# Patient Record
Sex: Female | Born: 1975
Health system: Southern US, Community
[De-identification: ages and names within clinical notes are randomized; demographics above are authoritative.]

## PROBLEM LIST (undated history)

## (undated) DIAGNOSIS — K589 Irritable bowel syndrome without diarrhea: Secondary | ICD-10-CM

## (undated) DIAGNOSIS — I959 Hypotension, unspecified: Secondary | ICD-10-CM

## (undated) HISTORY — PX: TUBAL LIGATION: SHX77

## (undated) HISTORY — PX: CERVICAL CERCLAGE: SHX1329

## (undated) HISTORY — DX: Irritable bowel syndrome, unspecified: K58.9

## (undated) HISTORY — DX: Hypotension, unspecified: I95.9

---

## 2004-06-24 ENCOUNTER — Inpatient Hospital Stay (HOSPITAL_COMMUNITY): Admission: AD | Admit: 2004-06-24 | Discharge: 2004-06-24 | Payer: Self-pay | Admitting: Obstetrics and Gynecology

## 2005-09-14 ENCOUNTER — Inpatient Hospital Stay (HOSPITAL_COMMUNITY): Admission: AD | Admit: 2005-09-14 | Discharge: 2005-09-19 | Payer: Self-pay | Admitting: Obstetrics

## 2005-09-18 ENCOUNTER — Encounter (INDEPENDENT_AMBULATORY_CARE_PROVIDER_SITE_OTHER): Payer: Self-pay | Admitting: *Deleted

## 2006-02-20 ENCOUNTER — Ambulatory Visit (HOSPITAL_COMMUNITY): Admission: RE | Admit: 2006-02-20 | Discharge: 2006-02-20 | Payer: Self-pay | Admitting: Obstetrics and Gynecology

## 2006-04-14 ENCOUNTER — Ambulatory Visit (HOSPITAL_COMMUNITY): Admission: RE | Admit: 2006-04-14 | Discharge: 2006-04-14 | Payer: Self-pay | Admitting: Obstetrics

## 2006-04-16 ENCOUNTER — Ambulatory Visit (HOSPITAL_COMMUNITY): Admission: RE | Admit: 2006-04-16 | Discharge: 2006-04-16 | Payer: Self-pay | Admitting: Obstetrics

## 2006-04-30 ENCOUNTER — Inpatient Hospital Stay (HOSPITAL_COMMUNITY): Admission: AD | Admit: 2006-04-30 | Discharge: 2006-04-30 | Payer: Self-pay | Admitting: Obstetrics

## 2006-05-29 ENCOUNTER — Ambulatory Visit (HOSPITAL_COMMUNITY): Admission: RE | Admit: 2006-05-29 | Discharge: 2006-05-29 | Payer: Self-pay | Admitting: Obstetrics

## 2006-06-11 ENCOUNTER — Ambulatory Visit (HOSPITAL_COMMUNITY): Admission: RE | Admit: 2006-06-11 | Discharge: 2006-06-11 | Payer: Self-pay | Admitting: Obstetrics

## 2006-06-14 ENCOUNTER — Inpatient Hospital Stay (HOSPITAL_COMMUNITY): Admission: AD | Admit: 2006-06-14 | Discharge: 2006-07-19 | Payer: Self-pay | Admitting: Obstetrics

## 2006-06-24 ENCOUNTER — Encounter: Payer: Self-pay | Admitting: Obstetrics

## 2006-09-25 ENCOUNTER — Inpatient Hospital Stay (HOSPITAL_COMMUNITY): Admission: AD | Admit: 2006-09-25 | Discharge: 2006-09-27 | Payer: Self-pay | Admitting: Obstetrics

## 2006-10-01 ENCOUNTER — Inpatient Hospital Stay (HOSPITAL_COMMUNITY): Admission: AD | Admit: 2006-10-01 | Discharge: 2006-10-02 | Payer: Self-pay | Admitting: Obstetrics

## 2007-06-24 ENCOUNTER — Ambulatory Visit (HOSPITAL_COMMUNITY): Admission: RE | Admit: 2007-06-24 | Discharge: 2007-06-24 | Payer: Self-pay | Admitting: Obstetrics

## 2007-08-05 ENCOUNTER — Ambulatory Visit (HOSPITAL_COMMUNITY): Admission: RE | Admit: 2007-08-05 | Discharge: 2007-08-05 | Payer: Self-pay | Admitting: Obstetrics

## 2007-08-25 ENCOUNTER — Ambulatory Visit (HOSPITAL_COMMUNITY): Admission: RE | Admit: 2007-08-25 | Discharge: 2007-08-25 | Payer: Self-pay | Admitting: Obstetrics

## 2007-09-22 ENCOUNTER — Ambulatory Visit (HOSPITAL_COMMUNITY): Admission: RE | Admit: 2007-09-22 | Discharge: 2007-09-22 | Payer: Self-pay | Admitting: Obstetrics

## 2007-10-07 ENCOUNTER — Ambulatory Visit (HOSPITAL_COMMUNITY): Admission: RE | Admit: 2007-10-07 | Discharge: 2007-10-07 | Payer: Self-pay | Admitting: Obstetrics

## 2007-10-27 ENCOUNTER — Ambulatory Visit (HOSPITAL_COMMUNITY): Admission: RE | Admit: 2007-10-27 | Discharge: 2007-10-27 | Payer: Self-pay | Admitting: Obstetrics

## 2007-12-11 ENCOUNTER — Inpatient Hospital Stay (HOSPITAL_COMMUNITY): Admission: AD | Admit: 2007-12-11 | Discharge: 2007-12-12 | Payer: Self-pay | Admitting: Obstetrics

## 2007-12-20 ENCOUNTER — Inpatient Hospital Stay (HOSPITAL_COMMUNITY): Admission: AD | Admit: 2007-12-20 | Discharge: 2007-12-23 | Payer: Self-pay | Admitting: Obstetrics

## 2008-02-17 ENCOUNTER — Ambulatory Visit (HOSPITAL_COMMUNITY): Admission: RE | Admit: 2008-02-17 | Discharge: 2008-02-17 | Payer: Self-pay | Admitting: Obstetrics

## 2010-05-27 ENCOUNTER — Encounter: Payer: Self-pay | Admitting: Obstetrics

## 2010-09-18 NOTE — Op Note (Signed)
Terri Cooper, ORD             ACCOUNT NO.:  [id number]   MEDICAL RECORD NO.:  [id number]          PATIENT TYPE:  AMB   LOCATION:  SDC                           FACILITY:  [facility]   PHYSICIAN:  Kathreen Cosier, M.D.DATE OF BIRTH:  02/08/1976   DATE OF PROCEDURE:  06/24/2007  DATE OF DISCHARGE:                               OPERATIVE REPORT   PREOPERATIVE DIAGNOSIS:  Incompetent cervix.   POSTOPERATIVE DIAGNOSIS:  Incompetent cervix.   PROCEDURE:  Cervical cerclage.   DESCRIPTION OF PROCEDURE:  After the spinal was placed, the patient was  placed in the lithotomy position, perineum and vagina prepped and  draped.  Bladder emptied with a straight catheter.  A weighted speculum  placed in the vagina.  Cervix was grasped with sponge forceps at 12  o'clock, and starting at that point using a #5 Mersilene band a  [employer] suture was placed and tied at 6 o'clock.  The patient  tolerated the procedure well, and taken to recovery room in good  condition.           ______________________________  Kathreen Cosier, M.D.     BAM/MEDQ  D:  06/24/2007  T:  06/25/2007  Job:  [id number]

## 2010-09-18 NOTE — H&P (Signed)
[name], [name]             ACCOUNT NO.:  [id number]   MEDICAL RECORD NO.:  [id number]          PATIENT TYPE:  INP   LOCATION:  9167                          FACILITY:  [facility]   PHYSICIAN:  Roseanna Rainbow, M.D.DATE OF BIRTH:  23-May-1975   DATE OF ADMISSION:  12/20/2007  DATE OF DISCHARGE:                              HISTORY & PHYSICAL   CHIEF COMPLAINT:  The patient is a 35 year old para 1 with an estimated  date of confinement of December 22, 2007, with an intrauterine pregnancy  at 39+ weeks complaining of contractions.   HISTORY OF PRESENT ILLNESS:  Please see the above.   SOCIAL HISTORY:  The patient is married, unemployed, denies any tobacco,  ethanol or drug use.   ALLERGIES:  No known drug allergies.   PAST GYN HISTORY:  Normal triad.   PAST OBSTETRICAL HISTORY:  There is a history of a 22-week loss felt to  be secondary to cervical insufficiency in 2008.  She has delivered of a  live born female at full term, 6 pounds 15 ounces vaginal delivery.  During that pregnancy, she received Delalutin and a cerclage was placed.   ANTEPARTUM COURSE:  The patient has received 17-hydroxyprogesterone  injections, also received a course of Procardia for tocolysis and she  had a cerclage placed as well for her history of cervical insufficiency.   PAST MEDICAL HISTORY:  She denies.   FAMILY HISTORY:  She denies.   PAST SURGICAL HISTORY:  Please see the above.   PRENATAL LABORATORY DATA:  Hemoglobin 12.2, hematocrit 36.3, and  platelets 251,000.  Blood type A+, antibody screen negative.  Sickle  cell trait negative.  RPR nonreactive.  Rubella immune.  Hepatitis B  surface antigen negative.  HIV nonreactive.  PPD negative.  Pap smear  negative.  GC probe negative.  Chlamydia probe negative.  Quad screen  normal.  One-hour GCT 113.  GBS negative on November 25, 2007.  Ultrasound  at 20 weeks 1 day, no previa, normal anatomy.  Ultrasound at 23 weeks,  appropriate growth.   Ultrasound at 27 weeks 0 days, appropriate growth.  Ultrasound at 29 weeks 1 day, appropriate growth.   PHYSICAL EXAMINATION:  Vital signs stable, afebrile.  Fetal heart  tracing reassuring.  Tocodynamometer, uterine contractions every 2  minutes.  Sterile vaginal exam per the RN and the cervix is 5 cm  dilated.   ASSESSMENT:  Primipara at term, late latent versus early active labor,  fetal heart tracing consistent with fetal well being.   PLAN:  Admission.  Expectant management.      Roseanna Rainbow, M.D.  Electronically Signed     LAJ/MEDQ  D:  12/20/2007  T:  12/21/2007  Job:  [id number]

## 2010-09-18 NOTE — Op Note (Signed)
NAMERANISHA, Terri Cooper             ACCOUNT NO.:  [id number]   MEDICAL RECORD NO.:  [id number]          PATIENT TYPE:  AMB   LOCATION:  SDC                           [facility]:  [facility]   PHYSICIAN:  Kathreen Cosier, M.D.DATE OF BIRTH:  09/11/1975   DATE OF PROCEDURE:  02/17/2008  DATE OF DISCHARGE:                               OPERATIVE REPORT   PREOPERATIVE DIAGNOSIS:  Multiparity.   POSTOPERATIVE DIAGNOSIS:  Multiparity.   PROCEDURE:  Laparoscopic tubal sterilization.   Under general anesthesia, the patient in lithotomy position, perineum  and vagina prepped and draped, bladder emptied with straight catheter.  Transverse subumbilical incision made, carried down to the fascia.  Fascia cleaned, grasped with two Kochers in the fascia and peritoneum  with Mayo scissors.  Sleeve of the trocar inserted intraperitoneally, 3  L carbon dioxide infused intraperitoneally.  The uterus, tubes, and  ovaries normal.  Visualizing scope inserted through the sleeve of the  trocar and cautery probe inserted through the sleeve of the scope.  Starting 1 inch from meconium on the right, the tube was cauterized, a  total of four places moving lateral from the first site of cautery.  Procedure was done in a similar fashion on the other side.  Each tube  was cauterized in 4 places each.  The patient tolerated the procedure  well.  CO2 allowed to escape from the peritoneal cavity.  Fascia closed  with one stitch of 0 Dexon.  Skin closed with subcuticular stitch of 4-0  Monocryl.           ______________________________  Kathreen Cosier, M.D.     BAM/MEDQ  D:  02/17/2008  T:  02/17/2008  Job:  [id number]

## 2010-09-21 NOTE — Discharge Summary (Signed)
Terri Cooper, Terri Cooper             ACCOUNT NO.:  [id number]   MEDICAL RECORD NO.:  [id number]          PATIENT TYPE:  INP   LOCATION:  9304                          [facility]ILITY:  [facility]   PHYSICIAN:  Kathreen Cosier, M.D.DATE OF BIRTH:  Sep 03, 1975   DATE OF ADMISSION:  09/14/2005  DATE OF DISCHARGE:  09/19/2005                                 DISCHARGE SUMMARY   SUMMARY:  The patient is a 35 year old gravida 1, EDC January 15, 2006 who  started cramping and spotting on May 12. Ultrasound performed showed  internal os was open, the cervix was 1.9 cm long. She was [redacted] weeks pregnant  and having premature contractions. It was decided to place on magnesium  sulfate. This was done. A Foley catheter was inserted. She was not leaking  any fluid. She was kept on total bed rest. The patient had a second  trimester spontaneous AB. On May 16, she had a nonviable female, Apgars 2  and 0. Post delivery hemoglobin 9.4. She was discharged on May 17 on Tylenol  #3 for pain and ampicillin 500 p.o. q.6h. for 5 days.   DISCHARGE DIAGNOSIS:  Status post second trimester spontaneous abortion at  20 weeks.           ______________________________  Kathreen Cosier, M.D.     BAM/MEDQ  D:  10/23/2005  T:  10/23/2005  Job:  [id number]

## 2010-09-21 NOTE — Consult Note (Signed)
Terri Cooper, Terri Cooper             ACCOUNT NO.:  [id number]   MEDICAL RECORD NO.:  [id number]          PATIENT TYPE:  AMB   LOCATION:  SDC                           [facility]   PHYSICIAN:  Kathreen Cosier, M.D.DATE OF BIRTH:  Mar 29, 1976   DATE OF CONSULTATION:  04/16/2006  DATE OF DISCHARGE:                                 CONSULTATION   PREOPERATIVE DIAGNOSES:  1. Incompetent cervix.  2. Intrauterine pregnancy at 14 weeks.   PROCEDURES:  Cervical cerclage.   ANESTHESIA:  Spinal.   DESCRIPTION OF PROCEDURE:  The patient was placed in the lithotomy  position after spinal administered.  Vagina prepped and draped.  Straight catheter with speculum placed in the vagina.  The cervix was  grasped with a sponge stick at 12 o'clock.  Using a #5 Mersilene band,  [employer] suture was placed high up on the cervix in the usual manner and  tied  at 6 o'clock.  There were no noted 2 small tears in the vagina,  and hemostasis was achieved with #0 chromic suture interrupted.  The  patient tolerated the procedure well and was taken to the recovery room  in good condition.           ______________________________  Kathreen Cosier, M.D.     BAM/MEDQ  D:  04/16/2006  T:  04/16/2006  Job:  [id number]

## 2010-09-21 NOTE — Discharge Summary (Signed)
NAMECASH, MEADOW             ACCOUNT NO.:  [id number]   MEDICAL RECORD NO.:  [id number]          PATIENT TYPE:  INP   LOCATION:                                [facility]   PHYSICIAN:  Kathreen Cosier, M.D.DATE OF BIRTH:  [birth year]   DATE OF ADMISSION:  10/01/2006  DATE OF DISCHARGE:  10/02/2006                               DISCHARGE SUMMARY   HISTORY OF PRESENT ILLNESS:  The patient is 35 year old gravida 2, para  1-0-1-1 with a normal vaginal delivery in Sep 25, 2006. She was admitted  for the chills, fever and lower abdominal pain and a temperature of  103.4.   HOSPITAL COURSE:  On admission the uterus was tender white count 8.4,  hemoglobin 10.5. She was admitted with the diagnosis of endometritis and  treated with ampicillin and gentamicin IV. She rapidly defervesced and  was discharged on May 29, day after admission, not having had any fever  for greater than 24 hours.  She was discharged home on ampicillin 500  p.o. q.6 hours to see me in 6 weeks.   DISCHARGE DIAGNOSIS:  Status post postpartum endometritis.           ______________________________  Kathreen Cosier, M.D.     BAM/MEDQ  D:  11/05/2006  T:  11/05/2006  Job:  [id number]

## 2010-09-21 NOTE — Discharge Summary (Signed)
[name], [name]             ACCOUNT NO.:  [id number]   MEDICAL RECORD NO.:  [id number]          PATIENT TYPE:  INP   LOCATION:  9154                          [facility]ILITY:  [facility]   PHYSICIAN:  Kathreen Cosier, M.D.DATE OF BIRTH:  [birth year]   DATE OF ADMISSION:  06/14/2006  DATE OF DISCHARGE:  07/19/2006                               DISCHARGE SUMMARY   HISTORY OF PRESENT ILLNESS:  The patient is a 35 year old gravida 2 para  0-0-1-0 who had 23 week spontaneous abortion in 2007. Her EDC was  10/12/06.  She has a history of incompetent cervix and she has a cervical  cerclage. She was admitted because of occasional cramping and she was in  for bed rest and magnesium sulfate.  Ultrasound on admission revealed 24  weeks and the membranes were funneling to the cerclage.  She received  ampicillin IV for 10 days and she had a Foley catheter placed.  On  06/19/06, her ampicillin was discontinued and she was started on Macrobid  1 p.o. b.i.d. because of her indwelling catheter.  On 06/19/06, she was  started on betamethasone 12.5 mg x2 doses.  The patient was continued  on her magnesium sulfate 2 grams per hour and it was decided at 28 weeks  for her to be discharged to home on bed rest. Her Foley catheter was  removed and she was started on Procardia 60 mg XL and she tolerated this  well with no contractions. Ultrasound while in the hospital revealed  normal growth of the baby. She was discharged on 07/19/06 on Procardia XL  60, Pepcid 20 mg p.o. b.i.d., Xanax 0.5 p.o. t.i.d. p.r.n. and on total  bed rest at home. She is to see me weekly for Delalutin injections in my  office.   DISCHARGE DIAGNOSIS:  Status post hospitalization for incompetent cervix  and premature contractions.           ______________________________  Kathreen Cosier, M.D.     BAM/MEDQ  D:  08/13/2006  T:  08/13/2006  Job:  [id number]

## 2011-01-25 LAB — CBC
Hemoglobin: 12.7
MCHC: 35
MCV: 89.7
RBC: 4.04
RDW: 13.2

## 2011-02-04 LAB — CBC
MCHC: 32.8
MCV: 89.1
Platelets: 292
RBC: 4.19

## 2011-02-04 LAB — PREGNANCY, URINE: Preg Test, Ur: NEGATIVE

## 2014-08-03 ENCOUNTER — Telehealth: Payer: Self-pay | Admitting: Internal Medicine

## 2014-08-03 NOTE — Telephone Encounter (Signed)
Call to patient to confirm appointment for 08/04/14 at 3:15 phone is not in service

## 2014-08-04 ENCOUNTER — Ambulatory Visit (INDEPENDENT_AMBULATORY_CARE_PROVIDER_SITE_OTHER): Payer: Medicaid Other | Admitting: Internal Medicine

## 2014-08-04 ENCOUNTER — Encounter: Payer: Self-pay | Admitting: Internal Medicine

## 2014-08-04 VITALS — BP 118/56 | HR 78 | Temp 98.1°F | Ht 61.9 in | Wt 139.6 lb

## 2014-08-04 DIAGNOSIS — Z7189 Other specified counseling: Secondary | ICD-10-CM

## 2014-08-04 DIAGNOSIS — Z7689 Persons encountering health services in other specified circumstances: Secondary | ICD-10-CM

## 2014-08-04 DIAGNOSIS — Z0000 Encounter for general adult medical examination without abnormal findings: Secondary | ICD-10-CM

## 2014-08-04 NOTE — Patient Instructions (Signed)
1. Please come to clinic for lab work next Wednesday afternoon.  In the meantime, I will send for records from your previous doctor.   2. Please take all medications as prescribed.    3. If you have worsening of your symptoms or new symptoms arise, please call the clinic [phone]), or go to the ER immediately if symptoms are severe.

## 2014-08-04 NOTE — Progress Notes (Signed)
Subjective:    Patient ID: Terri Cooper, female    DOB: 05/13/1975, 39 y.o.   MRN: [id number]  HPI Comments: Ms. Terri Cooper is a 39 year old woman who presents [facility].  Her husband is currently a patient and she is switching providers at his recommendation because he is pleased with the care he receives here.  She reports hx of hypotension dx last year (she has sympx of lightheadedness, fatigue that responds to food/hydration; full work-up unrevealing per patient) and constipation-predominate IBS dx as a teenager (previously on medication x 1 month in her early 3120s but preferred not to be on med, currently controlled by diet and peppermint tea).   She has no new acute complaints today.   Past PMH:  Hypotension, IBS, PMSD, vitamin D deficiency Past Surgery:  Cervical cerclage, wisdom teeth removed under general anesthesia, tubal ligation Past GYN hx:  G3P2; 1 fetal death at 5 months gestation.  Living children - a girl age 557 and a boy age 316.  FDLMP 07/22/14, periods regular, she follows with an ObGyn and reports most recent PAP was in early 2015, she denies hx of abnormal PAP Family hx: mom - DM, dad - HTN, maternal GM - died from breast CA in her 7050s, no other significant family hx that she is aware of Allergies reviewed:  Dyspnea with Codeine  Review of Systems  Constitutional: Negative for fever, chills, appetite change and unexpected weight change.  HENT: Negative for hearing loss.   Eyes: Negative for visual disturbance.  Respiratory: Negative for cough, shortness of breath and wheezing.   Cardiovascular: Negative for chest pain, palpitations and leg swelling.  Gastrointestinal: Positive for abdominal pain. Negative for nausea, vomiting, diarrhea, constipation and blood in stool.       Occasional belly pain that she feels is related to abdominal hernia.  Endocrine: Negative for polydipsia, polyphagia and polyuria.  Genitourinary: Negative for dysuria, frequency and  hematuria.  Neurological: Positive for dizziness and light-headedness. Negative for syncope.       Occasional lightheadedness and dizziness sometimes with position changes, sometimes at rest.   Psychiatric/Behavioral: Negative for dysphoric mood.       Filed Vitals:   08/04/14 1558  BP: 118/56  Pulse: 78  Temp: 98.1 F (36.7 C)  TempSrc: Oral  Height: 5' 1.9" (1.572 m)  Weight: 139 lb 9.6 oz (63.322 kg)  SpO2: 100%   Objective:   Physical Exam  Constitutional: She is oriented to person, place, and time. She appears well-developed. No distress.  HENT:  Head: Normocephalic and atraumatic.  Right Ear: External ear normal.  Left Ear: External ear normal.  Mouth/Throat: Oropharynx is clear and moist. No oropharyngeal exudate.  TMs with good cone of light B/L  Eyes: Conjunctivae and EOM are normal. Pupils are equal, round, and reactive to light. Right eye exhibits no discharge. Left eye exhibits no discharge. No scleral icterus.  Neck: Neck supple. No thyromegaly present.  Cardiovascular: Normal rate, regular rhythm and normal heart sounds.  Exam reveals no gallop and no friction rub.   No murmur heard. Pulmonary/Chest: Effort normal and breath sounds normal. No respiratory distress. She has no wheezes. She has no rales.  Abdominal: Soft. Bowel sounds are normal. She exhibits no distension and no mass. There is no tenderness. There is no rebound and no guarding.  Musculoskeletal: Normal range of motion. She exhibits no edema or tenderness.  Lymphadenopathy:    She has no cervical adenopathy.  Neurological:  She is alert and oriented to person, place, and time. She has normal reflexes. She displays normal reflexes. No cranial nerve deficit.  Skin: Skin is warm. She is not diaphoretic.  Psychiatric: She has a normal mood and affect. Her behavior is normal. Judgment and thought content normal.  Vitals reviewed.         Assessment & Plan:  Please see problem based assessment and  plan.

## 2014-08-05 ENCOUNTER — Encounter: Payer: Self-pay | Admitting: Internal Medicine

## 2014-08-05 DIAGNOSIS — Z0000 Encounter for general adult medical examination without abnormal findings: Secondary | ICD-10-CM | POA: Insufficient documentation

## 2014-08-05 DIAGNOSIS — K589 Irritable bowel syndrome without diarrhea: Secondary | ICD-10-CM | POA: Insufficient documentation

## 2014-08-05 NOTE — Assessment & Plan Note (Addendum)
Terri Cooper is a healthy 39 year old with hx of IBS (controlled) and hypotension (BP ok at 118/56 today) who experiences occasional dizziness and lightheadedness both with position change and at rest.  She says symptoms responds to hydration and eating.  She reports full work-up completed last year did not reveal etiology and she was instructed to stay hydrated.  Overall, she feels well today and does not have specific complaints she wants addressed.  She was last seen by her prior doctor about 1 year ago.  She also follows with ObGyn and reports being up to date on PAP smear.    - The lab was closed at the time of the visit so I have asked the patient to come back next week for basic blood work (CBC, CMP, lipids). - I will send for records from her prior provider. - I will determine the timeframe for follow-up based on results of labs and review of records; given relatively good health and young age she will likely only need yearly follow-up.

## 2014-08-08 NOTE — Progress Notes (Signed)
Case discussed with Dr. Wilson soon after the resident saw the patient. We reviewed the resident's history and exam and pertinent patient test results. I agree with the assessment, diagnosis, and plan of care documented in the resident's note. 

## 2014-11-16 NOTE — Addendum Note (Signed)
Addended by: Remus BlakeBARROW, Jhania Etherington K on: 11/16/2014 12:01 PM   Modules accepted: Orders

## 2015-03-29 ENCOUNTER — Encounter: Payer: Medicaid Other | Admitting: Internal Medicine

## 2015-04-17 ENCOUNTER — Other Ambulatory Visit: Payer: Self-pay | Admitting: Obstetrics

## 2015-04-17 DIAGNOSIS — Z1231 Encounter for screening mammogram for malignant neoplasm of breast: Secondary | ICD-10-CM

## 2015-04-19 ENCOUNTER — Encounter: Payer: Self-pay | Admitting: Internal Medicine

## 2015-04-19 ENCOUNTER — Ambulatory Visit (INDEPENDENT_AMBULATORY_CARE_PROVIDER_SITE_OTHER): Payer: Medicaid Other | Admitting: Internal Medicine

## 2015-04-19 VITALS — BP 114/61 | HR 76 | Temp 97.9°F | Ht 61.5 in | Wt 138.5 lb

## 2015-04-19 DIAGNOSIS — M7989 Other specified soft tissue disorders: Secondary | ICD-10-CM | POA: Diagnosis not present

## 2015-04-19 DIAGNOSIS — H04123 Dry eye syndrome of bilateral lacrimal glands: Secondary | ICD-10-CM

## 2015-04-19 DIAGNOSIS — R42 Dizziness and giddiness: Secondary | ICD-10-CM

## 2015-04-19 DIAGNOSIS — K589 Irritable bowel syndrome without diarrhea: Secondary | ICD-10-CM | POA: Diagnosis not present

## 2015-04-19 DIAGNOSIS — Z0000 Encounter for general adult medical examination without abnormal findings: Secondary | ICD-10-CM

## 2015-04-19 NOTE — Progress Notes (Signed)
Subjective:    Patient ID: Terri Cooper, female    DOB: 21-Jul-1975, 39 y.o.   MRN: [id number]  HPI Comments: Terri Cooper is a 39 year old woman with PMH of IBS here for follow-up.  She tells me she felt it was time for a check-up because it is the end of the year.  She is overall feeling well but does have c/o dry eyes x 1 year and vertigo for a few years.  Please see problem based charting for status of these conditions.     Past Medical History  Diagnosis Date  . Irritable bowel syndrome (IBS)     constipation predominate  . Hypotension    Current Outpatient Prescriptions on File Prior to Visit  Medication Sig Dispense Refill  . Multiple Vitamin (MULTIVITAMIN) tablet Take 1 tablet by mouth daily.     No current facility-administered medications on file prior to visit.    Review of Systems  Constitutional: Negative for fever, chills, appetite change and unexpected weight change.  HENT: Negative for ear discharge, ear pain, hearing loss and tinnitus.   Eyes: Negative for photophobia, pain, discharge, redness, itching and visual disturbance.       Dry eyes x 1 year.  No infectious signs.  Stopped wearing contacts 9 year ago.  Refresh is helping.   Respiratory: Negative for cough, shortness of breath and wheezing.   Cardiovascular: Positive for leg swelling. Negative for chest pain and palpitations.       B/L feet swlling (right > left)  Gastrointestinal: Positive for abdominal pain and constipation. Negative for nausea, vomiting, diarrhea and blood in stool.       She feels peppermint tea is helping.  Controlling stress helps.   Genitourinary: Negative for menstrual problem and pelvic pain.  Neurological: Positive for dizziness. Negative for syncope and light-headedness.       Vertigo - feels like the room is spinning, helps to sit down; occurs a few times per year.  Last episode was last month and related to not sleeping.  Usually lasts about 1 hour.   Same as what she reports  last visit.  Not worse.       Filed Vitals:   04/19/15 1507  BP: 114/61  Pulse: 76  Temp: 97.9 F (36.6 C)  TempSrc: Oral  Height: 5' 1.5" (1.562 m)  Weight: 138 lb 8 oz (62.823 kg)  SpO2: 100%  , Objective:   Physical Exam  Constitutional: She is oriented to person, place, and time. She appears well-developed. No distress.  HENT:  Head: Normocephalic and atraumatic.  Mouth/Throat: Oropharynx is clear and moist. No oropharyngeal exudate.  Eyes: Conjunctivae and EOM are normal. Pupils are equal, round, and reactive to light. Right eye exhibits no discharge. Left eye exhibits no discharge. No scleral icterus.  Neck: Neck supple.  Cardiovascular: Normal rate, regular rhythm, normal heart sounds and intact distal pulses.  Exam reveals no gallop and no friction rub.   No murmur heard. Pulmonary/Chest: Effort normal and breath sounds normal. No respiratory distress. She has no wheezes. She has no rales.  Abdominal: Soft. Bowel sounds are normal. She exhibits no distension and no mass. There is no tenderness. There is no rebound and no guarding.  Musculoskeletal: Normal range of motion. She exhibits no edema or tenderness.  Neurological: She is alert and oriented to person, place, and time. She displays normal reflexes. No cranial nerve deficit. She exhibits normal muscle tone.  Strength and sensation grossly intact.  Negative  Dix-Hallpike  Skin: Skin is warm. She is not diaphoretic.  Psychiatric: She has a normal mood and affect. Her behavior is normal. Judgment and thought content normal.  Vitals reviewed.         Assessment & Plan:  Please see problem based charting for A&P.

## 2015-04-19 NOTE — Patient Instructions (Signed)
1. I will try to get records from your prior doctor.  Please also ask your gynecologist to send me records.  It is okay to continue Refresh as needed for your dry eye.  If you develop eye redness, discharge, loss of vision or new symptoms you need to come back to see me or see an eye doctor.  Please call me back and let me know what medication you used for your dizziness.   2. Please take all medications as prescribed.    3. If you have worsening of your symptoms or new symptoms arise, please call the clinic ([phone]), or go to the ER immediately if symptoms are severe.   Come back to see me in 3 months for your yearly physical or sooner if dizziness persists.  We may need to refer you to a specialist at that time.

## 2015-04-21 DIAGNOSIS — M7989 Other specified soft tissue disorders: Secondary | ICD-10-CM | POA: Insufficient documentation

## 2015-04-21 DIAGNOSIS — H04123 Dry eye syndrome of bilateral lacrimal glands: Secondary | ICD-10-CM | POA: Insufficient documentation

## 2015-04-21 DIAGNOSIS — Z0000 Encounter for general adult medical examination without abnormal findings: Secondary | ICD-10-CM | POA: Insufficient documentation

## 2015-04-21 DIAGNOSIS — R42 Dizziness and giddiness: Secondary | ICD-10-CM | POA: Insufficient documentation

## 2015-04-21 NOTE — Assessment & Plan Note (Signed)
Assessment:  She says her feet sometimes swell after sitting for long periods at work.  Feet below ankles, R > L.  Legs not involved.  If she moves around or puts feet up this resolves.  There is no swelling today.  She has no S&S of heart, liver or kidney problems.  She is very young for venous insufficiency but I do suspect the mild swelling is positional from sitting with feet down for long periods. Plan:  Continue to elevate when able.  I advised she buy some compression-type socks or hose.  She will follow-up with me for yearly check-up in 3 months.

## 2015-04-21 NOTE — Assessment & Plan Note (Addendum)
Assessment:  Non-positional, lasting about 1 hour and negative Dix-Hallpike make BPPV less likely.  She denies headache or N/V so vertiginous migraine less likely.  No tinnitus or hearing loss to suggest Meniere's.  Her young age, lack of CVD risk factors, chronicity and lack of neurologic abnormalities on physical exam make more serious central pathology unlikely.  There is no indication for urgent MRI.  Furthermore, she responded to her mother's vertigo medication (I suspect this was meclizine).  She says her mother has this problem as well and told her it runs in the family.  She has used the medication twice with immediate relief in symptoms.  At our last visit, she reported lightheadedness and low BP but says this has completely resolved.   Plan:  She feels that episodes are related to inadequate sleep so she will avoid this.  She will call me with the name of the medication her mother gave her.  No indication for urgent MRI but she will return in 3 months for yearly exam.  Will reassess at that time.  If new or worsening symptoms, will consider MRI to r/o MS or Chiari Malformation.

## 2015-04-21 NOTE — Assessment & Plan Note (Signed)
Assessment:  She says symptoms are controlled with peppermint tea, controlling stress and exercising. Plan:  She will continue above.  If symptoms return can consider medication in the future.

## 2015-04-21 NOTE — Assessment & Plan Note (Signed)
Assessment:  Intermittent dry eyes, but no itching, redness, pain, red eye or vision loss to suggest infection or ischemia.  She has not worn contact lenses in 9 years.  She is occasionally using Refresh drops which provide relief.   Plan:  Continue prn Refresh.  She was advised to visit eye doctor if new or worsening symptoms.  She will return to see me for routine check-up in 3 months.

## 2015-04-21 NOTE — Assessment & Plan Note (Signed)
She reports recent PAP at gyn.  Will send for records.

## 2015-04-25 NOTE — Addendum Note (Signed)
Addended by: Doneen PoissonKLIMA, Sharrell Krawiec D on: 04/25/2015 10:58 AM   Modules accepted: Level of Service

## 2015-04-25 NOTE — Progress Notes (Signed)
Case discussed with Dr. Wislon soon after the resident saw the patient.  We reviewed the resident's history and exam and pertinent patient test results.  I agree with the assessment, diagnosis and plan of care documented in the resident's note. 

## 2015-05-09 ENCOUNTER — Ambulatory Visit: Payer: Medicaid Other

## 2016-01-27 LAB — HM PAP SMEAR

## 2016-03-11 ENCOUNTER — Telehealth: Payer: Self-pay | Admitting: *Deleted

## 2016-03-11 ENCOUNTER — Encounter (HOSPITAL_COMMUNITY): Payer: Self-pay | Admitting: Emergency Medicine

## 2016-03-11 ENCOUNTER — Emergency Department (HOSPITAL_COMMUNITY)
Admission: EM | Admit: 2016-03-11 | Discharge: 2016-03-11 | Disposition: A | Discharge status: Home / Self Care | Payer: Medicaid Other | Attending: Emergency Medicine | Admitting: Emergency Medicine

## 2016-03-11 DIAGNOSIS — R55 Syncope and collapse: Secondary | ICD-10-CM | POA: Insufficient documentation

## 2016-03-11 DIAGNOSIS — R42 Dizziness and giddiness: Secondary | ICD-10-CM

## 2016-03-11 LAB — COMPREHENSIVE METABOLIC PANEL
ALK PHOS: 44 U/L (ref 38–126)
ALT: 16 U/L (ref 14–54)
ANION GAP: 9 (ref 5–15)
AST: 18 U/L (ref 15–41)
Albumin: 4 g/dL (ref 3.5–5.0)
BILIRUBIN TOTAL: 0.7 mg/dL (ref 0.3–1.2)
BUN: 10 mg/dL (ref 6–20)
CALCIUM: 9.3 mg/dL (ref 8.9–10.3)
CO2: 26 mmol/L (ref 22–32)
Chloride: 104 mmol/L (ref 101–111)
Creatinine, Ser: 0.79 mg/dL (ref 0.44–1.00)
GLUCOSE: 97 mg/dL (ref 65–99)
POTASSIUM: 4.2 mmol/L (ref 3.5–5.1)
Sodium: 139 mmol/L (ref 135–145)
TOTAL PROTEIN: 8.1 g/dL (ref 6.5–8.1)

## 2016-03-11 LAB — CBC WITH DIFFERENTIAL/PLATELET
BASOS PCT: 0 %
Basophils Absolute: 0 10*3/uL (ref 0.0–0.1)
Eosinophils Absolute: 0.2 10*3/uL (ref 0.0–0.7)
Eosinophils Relative: 3 %
HEMATOCRIT: 40.6 % (ref 36.0–46.0)
HEMOGLOBIN: 13.8 g/dL (ref 12.0–15.0)
LYMPHS ABS: 2.3 10*3/uL (ref 0.7–4.0)
LYMPHS PCT: 30 %
MCH: 30.3 pg (ref 26.0–34.0)
MCHC: 34 g/dL (ref 30.0–36.0)
MCV: 89 fL (ref 78.0–100.0)
MONO ABS: 0.6 10*3/uL (ref 0.1–1.0)
MONOS PCT: 8 %
NEUTROS ABS: 4.5 10*3/uL (ref 1.7–7.7)
NEUTROS PCT: 59 %
Platelets: 303 10*3/uL (ref 150–400)
RBC: 4.56 MIL/uL (ref 3.87–5.11)
RDW: 13 % (ref 11.5–15.5)
WBC: 7.7 10*3/uL (ref 4.0–10.5)

## 2016-03-11 LAB — POC URINE PREG, ED: Preg Test, Ur: NEGATIVE

## 2016-03-11 MED ORDER — MECLIZINE HCL 25 MG PO TABS: 25.0000 mg | ORAL_TABLET | Freq: Three times a day (TID) | ORAL | 0 refills | Status: DC | PRN

## 2016-03-11 MED ORDER — SODIUM CHLORIDE 0.9 % IV BOLUS (SEPSIS)
1000.0000 mL | Freq: Once | INTRAVENOUS | Status: AC
  Administered 2016-03-11: 1000 mL via INTRAVENOUS

## 2016-03-11 MED ORDER — MECLIZINE HCL 25 MG PO TABS
25.0000 mg | ORAL_TABLET | Freq: Once | ORAL | Status: AC
  Administered 2016-03-11: 25 mg via ORAL
  Filled 2016-03-11: qty 1

## 2016-03-11 NOTE — Discharge Instructions (Signed)
Medications: Meclizine  Treatment: Take meclizine up to 3 times daily as prescribed as needed for dizziness. Make sure to drink plenty of water, at least 64 ounces daily. Do not take Mucinex, as this could be a cause of your dizziness. It may be advantageous for you to slightly increase her salt intake to elevate your blood pressure.  Follow-up: Please follow-up with your primary care provider in next 2-3 days for further evaluation and treatment of your symptoms. Please return to emergency department if you develop any new or worsening symptoms.

## 2016-03-11 NOTE — ED Provider Notes (Signed)
[facility]-EMERGENCY DEPT Provider Note   CSN: [id number] Arrival date & time: 03/11/16  1531     History   Chief Complaint Chief Complaint  Patient presents with  . Near Syncope    HPI Terri Cooper is a 40 y.o. female with history of hypertension who presents with a 2 day history of multiple near syncopal episodes. Patient reports that she has been experiencing lightheadedness, especially with standing. Patient first began feeling lightheaded while standing helping her son brush his teeth. It made her fall to the floor. She did not lose consciousness or hit her head. Patient felt better this morning and went to work. Patient then began feeling worse this afternoon having lightheadedness symptoms again. Patient reports drinking Gatorade helped her symptoms, however patient ate lunch and in her symptoms returned. Patient reports that she began having nasal congestion last week and has been taking Mucinex-D. She also reports just finishing her period, which is very heavy normally. Patient took some of her mother's meclizine with good relief. Patient is a family history of vertigo. Patient notes noted fatigue, but denies any headaches, tinnitus, chest pain, shortness of breath, abdominal pain, nausea, vomiting, urinary symptoms.  HPI  Past Medical History:  Diagnosis Date  . Hypotension   . Irritable bowel syndrome (IBS)    constipation predominate    Patient Active Problem List   Diagnosis Date Noted  . Vertigo 04/21/2015  . Dry eyes 04/21/2015  . Bilateral swelling of feet 04/21/2015  . Healthcare maintenance 04/21/2015  . Irritable bowel syndrome (IBS) 08/05/2014  . Well adult exam 08/05/2014    Past Surgical History:  Procedure Laterality Date  . CERVICAL CERCLAGE    . TUBAL LIGATION      OB History    No data available       Home Medications    Prior to Admission medications   Medication Sig Start Date End Date Taking? Authorizing Provider  ibuprofen  (ADVIL,MOTRIN) 200 MG tablet Take 200-400 mg by mouth every 6 (six) hours as needed for headache (or pain).    Yes Historical Provider, MD  Multiple Vitamin (MULTIVITAMIN) tablet Take 1 tablet by mouth daily.   Yes Historical Provider, MD  pseudoephedrine-guaifenesin (MUCINEX D) 60-600 MG 12 hr tablet Take 1 tablet by mouth every 12 (twelve) hours as needed for congestion.    Yes Historical Provider, MD  meclizine (ANTIVERT) 25 MG tablet Take 1 tablet (25 mg total) by mouth 3 (three) times daily as needed for dizziness. 03/11/16   Emi Holes, PA-C    Family History Family History  Problem Relation Age of Onset  . Diabetes Mother   . Hypertension Father   . Cancer Maternal Grandmother     Social History Social History  Substance Use Topics  . Smoking status: Never Smoker  . Smokeless tobacco: Never Used  . Alcohol use No     Allergies   Codeine   Review of Systems Review of Systems  Constitutional: Negative for chills and fever.  HENT: Negative for facial swelling and sore throat.   Respiratory: Negative for shortness of breath.   Cardiovascular: Negative for chest pain.  Gastrointestinal: Negative for abdominal pain, nausea and vomiting.  Genitourinary: Negative for dysuria.  Musculoskeletal: Negative for back pain and neck pain.  Skin: Negative for rash and wound.  Neurological: Positive for light-headedness. Negative for headaches.  Psychiatric/Behavioral: The patient is not nervous/anxious.      Physical Exam Updated Vital Signs BP 107/78   Pulse 63  Temp 98.7 F (37.1 C) (Oral)   Resp 17   Ht 5\' 2"  (1.575 m)   Wt 57.6 kg   LMP 03/05/2016 (Exact Date)   SpO2 100%   BMI 23.23 kg/m   Physical Exam  Constitutional: She appears well-developed and well-nourished. No distress.  Patient off balance upon standing on exam; patient was a symptomatic prior  HENT:  Head: Normocephalic and atraumatic.  Mouth/Throat: Oropharynx is clear and moist. No  oropharyngeal exudate.  Eyes: Conjunctivae and EOM are normal. Pupils are equal, round, and reactive to light. Right eye exhibits no discharge. Left eye exhibits no discharge. No scleral icterus.  Neck: Normal range of motion. Neck supple. No thyromegaly present.  Cardiovascular: Normal rate, regular rhythm, normal heart sounds and intact distal pulses.  Exam reveals no gallop and no friction rub.   No murmur heard. Pulmonary/Chest: Effort normal and breath sounds normal. No stridor. No respiratory distress. She has no wheezes. She has no rales.  Abdominal: Soft. Bowel sounds are normal. She exhibits no distension. There is no tenderness. There is no rebound and no guarding.  Musculoskeletal: She exhibits no edema.  Lymphadenopathy:    She has no cervical adenopathy.  Neurological: She is alert. Coordination normal.  CN 3-12 intact; normal sensation throughout; 5/5 strength in all 4 extremities; equal bilateral grip strength; no ataxia on finger to nose   Skin: Skin is warm and dry. No rash noted. She is not diaphoretic. No pallor.  Psychiatric: She has a normal mood and affect.  Nursing note and vitals reviewed.    ED Treatments / Results  Labs (all labs ordered are listed, but only abnormal results are displayed) Labs Reviewed  CBC WITH DIFFERENTIAL/PLATELET  COMPREHENSIVE METABOLIC PANEL  POC URINE PREG, ED    EKG  EKG Interpretation  Date/Time:  Monday March 11 2016 18:07:06 EST Ventricular Rate:  83 PR Interval:    QRS Duration: 112 QT Interval:  372 QTC Calculation: 438 R Axis:   62 Text Interpretation:  Sinus rhythm Borderline intraventricular conduction delay Low voltage, precordial leads No old tracing to compare Confirmed by [facility]  MD, DAVID ([id number]) on 03/11/2016 6:44:50 PM       Radiology No results found.  Procedures Procedures (including critical care time)  Medications Ordered in ED Medications  sodium chloride 0.9 % bolus 1,000 mL (0 mLs Intravenous  Stopped 03/11/16 2014)  meclizine (ANTIVERT) tablet 25 mg (25 mg Oral Given 03/11/16 2011)     Initial Impression / Assessment and Plan / ED Course  I have reviewed the triage vital signs and the nursing notes.  Pertinent labs & imaging results that were available during my care of the patient were reviewed by me and considered in my medical decision making (see chart for details).  Clinical Course     CBC, CMP unremarkable. EKG shows NSR. Urine pregnancy negative. Limit fluid bolus and Antivert given in the ED. Orthostatic vitals show some orthostasis. Patient symptoms probably due to dehydration, Mucinex, or combination. Doubt central cause of vertigo. Patient discharged with meclizine. Advised to increase fluid, as well as salt intake. Follow-up to PCP in 2-3 days for recheck. Return precautions discussed. Patient understands and agrees with plan. Patient vitals stable throughout ED course and discharged in satisfactory condition. I discussed patient case with Dr. Preston FleetingGlick who guided the patient's management and agrees with plan.  Final Clinical Impressions(s) / ED Diagnoses   Final diagnoses:  Dizziness  Near syncope    New Prescriptions New Prescriptions  MECLIZINE (ANTIVERT) 25 MG TABLET    Take 1 tablet (25 mg total) by mouth 3 (three) times daily as needed for dizziness.     Emi Holeslexandra M Lanyia Jewel, PA-C 03/11/16 2037    Dione Boozeavid Glick, MD 03/12/16 [phone]

## 2016-03-11 NOTE — Telephone Encounter (Signed)
Pt's spouse calls and states pt "passed out" at work, ems was called he states and they told him pt needed an emergent appt w/ pcp for a "work up", he would like to bring her in now, she fell to the floor, he does not know if she has any injuries or struck her head, he is advised to bring her to the [facility] and call for a f/u appt if pt not admitted.

## 2016-03-11 NOTE — ED Triage Notes (Signed)
Pt st's she had a near syncopal episode yesterday and today.  St's she fell but did not hit her head.  Pt st's she was on her menses last week and was very heavy.  Pt st's when she stands or walks she becomes dizzy

## 2016-03-14 ENCOUNTER — Telehealth: Payer: Self-pay | Admitting: General Practice

## 2016-03-14 NOTE — Telephone Encounter (Signed)
APT. REMINDER CALL, LMTCB °

## 2016-03-15 ENCOUNTER — Ambulatory Visit (INDEPENDENT_AMBULATORY_CARE_PROVIDER_SITE_OTHER): Payer: Medicaid Other | Admitting: Internal Medicine

## 2016-03-15 VITALS — BP 119/68 | HR 78 | Temp 98.0°F | Ht 62.0 in | Wt 141.2 lb

## 2016-03-15 DIAGNOSIS — R42 Dizziness and giddiness: Secondary | ICD-10-CM | POA: Diagnosis present

## 2016-03-15 MED ORDER — DEXTROMETHORPHAN-GUAIFENESIN 10-100 MG/5ML PO SYRP: 5.0000 mL | ORAL_SOLUTION | Freq: Two times a day (BID) | ORAL | 0 refills | Status: DC

## 2016-03-15 MED ORDER — DIMENHYDRINATE 50 MG PO CHEW: 50.0000 mg | CHEWABLE_TABLET | Freq: Four times a day (QID) | ORAL | 0 refills | Status: DC | PRN

## 2016-03-15 NOTE — Assessment & Plan Note (Signed)
Assessment: lightheadedness   Patient states that she usually gets dizzy and lightheaded upon standing. In office orthostatic vitals were negative. Lying down blood pressure 99/58 standing blood pressure 106/63.   Today her blood pressures are soft and this is chronic. She states she doesn't really like salt and has a low salt intake diet.  Her low blood pressures could be contributing to her lightheadedness upon standing. Recommended good fluid intake, compression stockings as her job requires her to be on her feet for extended periods of time, and increase salt intake in diet.  Plan - advised using compression stockings - increase salt intake

## 2016-03-15 NOTE — Patient Instructions (Addendum)
Ms. Terri Cooper,  It was a pleasure meeting you today. I have sent over your medications to the pharmacy.  This includes Robitussin and Dramamine. There is an over-the-counter option for nonsedating Dramamine.  I recommend you getting the nonsedating Dramamine. If your symptoms do not improve in a week please come back to see us

## 2016-03-15 NOTE — Assessment & Plan Note (Addendum)
Assessment: Vertigo Patient was seen in the ED on 11/6 for a dizzy spell. Today she states that she has continued improvement however still feels off balance at times. She states that prior to her dizzy spell on 11/6 she started having nasal congestion and ear pain when yawning. On exam her left ear cannal was mildly erythematous with a mild effusion behind the left tympanic membrane.  I think her dizziness can be attributed to a  viral infection affecting the ear.  She states she is improving and I have recommended taking Robitussin.  Since meclizine doesn't seem to help I recommended trying an over the counter non-sedating Dramamine for her vertigo-like symptoms. Advised to return in a week if she did not improve or symptoms are worsening as she may need an antibiotic then.  Plan -Robitussin with guaifenesin -Dramamine, recommended nonsedating over-the-counter - Return in a week if symptoms worsen

## 2016-03-15 NOTE — Progress Notes (Signed)
   CC: Hospital follow-up for dizziness  HPI:  [name] is a 40 y.o. woman with history noted below that presents to the internal medicine clinic for hospital follow-up for dizziness. 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 She visited the ED and was given IV fluids and discharged with meclizine. Today she states she feels improvement with dizziness but at times feels off balance and mainly occurs when she stands from a seated position. She states she takes meclizine and this usually helps but since the ED visit has taken it twice and has not relieved her symptoms. Patient reports nasal congestion and ear pain when yawning that started on 11/2. She had been taking Mucinex for her symptoms with some benefit. She denies any fever or chills, nausea or vomiting, vision changes, weakness, change in sensation, or hearing loss.  Past Medical History:  Diagnosis Date  . Hypotension   . Irritable bowel syndrome (IBS)    constipation predominate    Review of Systems:  Per HPI  Physical Exam:  Vitals:   03/15/16 0935  BP: 119/68  Pulse: 78  Temp: 98 F (36.7 C)  TempSrc: Oral  SpO2: 100%  Weight: 141 lb 3.2 oz (64 kg)  Height: 5\' 2"  (1.575 m)   Physical Exam  Constitutional: She is well-developed, well-nourished, and in no distress.  HENT:  Tympanic membranes with normal light reflex in the right ear Erythema noted in the left external auditory canal with mild effusion behind the left tympanic membrane  Cardiovascular: Normal rate and normal heart sounds.  Exam reveals no gallop and no friction rub.   No murmur heard. Pulmonary/Chest: Effort normal and breath sounds normal. No respiratory distress. She has no wheezes. She has no rales.    Assessment & Plan:   See encounters tab for problem based medical decision making.   Patient seen with Dr. Criselda PeachesMullen

## 2016-03-18 NOTE — Progress Notes (Signed)
Internal Medicine Clinic Attending  I saw and evaluated the patient.  I personally confirmed the key portions of the history and exam documented by Dr. Hoffman and I reviewed pertinent patient test results.  The assessment, diagnosis, and plan were formulated together and I agree with the documentation in the resident's note.      

## 2016-10-11 ENCOUNTER — Other Ambulatory Visit: Payer: Self-pay | Admitting: Internal Medicine

## 2016-10-11 DIAGNOSIS — Z1231 Encounter for screening mammogram for malignant neoplasm of breast: Secondary | ICD-10-CM

## 2016-10-29 ENCOUNTER — Ambulatory Visit
Admission: RE | Admit: 2016-10-29 | Discharge: 2016-10-29 | Disposition: A | Discharge status: Home / Self Care | Payer: Medicaid Other | Source: Ambulatory Visit | Attending: Family Medicine | Admitting: Family Medicine

## 2016-10-29 DIAGNOSIS — Z1231 Encounter for screening mammogram for malignant neoplasm of breast: Secondary | ICD-10-CM

## 2016-10-31 ENCOUNTER — Other Ambulatory Visit: Payer: Self-pay | Admitting: Family Medicine

## 2016-10-31 DIAGNOSIS — R928 Other abnormal and inconclusive findings on diagnostic imaging of breast: Secondary | ICD-10-CM

## 2016-11-01 ENCOUNTER — Ambulatory Visit
Admission: RE | Admit: 2016-11-01 | Discharge: 2016-11-01 | Disposition: A | Discharge status: Home / Self Care | Payer: Medicaid Other | Source: Ambulatory Visit | Attending: Family Medicine | Admitting: Family Medicine

## 2016-11-01 ENCOUNTER — Other Ambulatory Visit: Payer: Self-pay | Admitting: Family Medicine

## 2016-11-01 DIAGNOSIS — R928 Other abnormal and inconclusive findings on diagnostic imaging of breast: Secondary | ICD-10-CM

## 2016-11-01 DIAGNOSIS — N6310 Unspecified lump in the right breast, unspecified quadrant: Secondary | ICD-10-CM

## 2016-11-01 DIAGNOSIS — R599 Enlarged lymph nodes, unspecified: Secondary | ICD-10-CM

## 2016-11-04 ENCOUNTER — Other Ambulatory Visit: Payer: Medicaid Other

## 2016-11-05 ENCOUNTER — Ambulatory Visit
Admission: RE | Admit: 2016-11-05 | Discharge: 2016-11-05 | Disposition: A | Discharge status: Home / Self Care | Payer: Medicaid Other | Source: Ambulatory Visit | Attending: Family Medicine | Admitting: Family Medicine

## 2016-11-05 ENCOUNTER — Other Ambulatory Visit: Payer: Self-pay | Admitting: Family Medicine

## 2016-11-05 DIAGNOSIS — N6310 Unspecified lump in the right breast, unspecified quadrant: Secondary | ICD-10-CM

## 2016-11-05 DIAGNOSIS — R599 Enlarged lymph nodes, unspecified: Secondary | ICD-10-CM

## 2016-11-07 ENCOUNTER — Encounter: Payer: Medicaid Other | Admitting: Internal Medicine

## 2016-11-21 ENCOUNTER — Encounter: Payer: Medicaid Other | Admitting: Internal Medicine

## 2016-12-05 ENCOUNTER — Ambulatory Visit (INDEPENDENT_AMBULATORY_CARE_PROVIDER_SITE_OTHER): Payer: Medicaid Other | Admitting: Internal Medicine

## 2016-12-05 VITALS — BP 109/62 | HR 70 | Temp 98.0°F | Wt 158.0 lb

## 2016-12-05 DIAGNOSIS — R5382 Chronic fatigue, unspecified: Secondary | ICD-10-CM

## 2016-12-05 NOTE — Progress Notes (Signed)
   CC: Fatigue  HPI:  [name] is a 41 y.o. female with history noted below that presents to the internal medicine clinic for several month history of fatigue.  She states that she notices the fatigue right before her menstrual period and goes on for 2 weeks.  She states that her menstrual period lasts for 7 days and is usually heavy during that time.  She has associated symptoms of dizziness, mainly when she doesn't eat and when she stands up too quickly from a seated position.  She denies syncope, fever/chills, weight loss, nausea/vomiting, chest pain or shortness of breath.   Past Medical History:  Diagnosis Date  . Hypotension   . Irritable bowel syndrome (IBS)    constipation predominate    Review of Systems:  Review of Systems  Constitutional: Positive for malaise/fatigue. Negative for weight loss.  Eyes: Negative for blurred vision.  Respiratory: Negative for shortness of breath.   Cardiovascular: Negative for chest pain.  Skin: Negative for rash.  Neurological: Positive for dizziness. Negative for headaches.  Psychiatric/Behavioral: Negative for depression.     Physical Exam:  Vitals:   12/05/16 1514  BP: 109/62  Pulse: 70  Temp: 98 F (36.7 C)  TempSrc: Oral  SpO2: 99%  Weight: 158 lb (71.7 kg)   Physical Exam  Constitutional: She is well-developed, well-nourished, and in no distress.  Cardiovascular: Normal rate, regular rhythm and normal heart sounds.  Exam reveals no gallop and no friction rub.   No murmur heard. Pulmonary/Chest: Effort normal and breath sounds normal. No respiratory distress. She has no wheezes. She has no rales. She exhibits no tenderness.  Musculoskeletal: She exhibits no edema.  Skin: Skin is warm and dry.     Assessment & Plan:   See encounters tab for problem based medical decision making.    Patient discussed with Dr. Criselda PeachesMullen

## 2016-12-05 NOTE — Patient Instructions (Addendum)
Ms. Terri Cooper,  It was a pleasure seeing you today. I will call you with your lab results. Please follow up with me in 6 months. Please drink plenty of water to keep yourself hydrated.

## 2016-12-06 ENCOUNTER — Telehealth: Payer: Self-pay

## 2016-12-06 LAB — CBC
HEMATOCRIT: 40.1 % (ref 34.0–46.6)
Hemoglobin: 13 g/dL (ref 11.1–15.9)
MCH: 29 pg (ref 26.6–33.0)
MCHC: 32.4 g/dL (ref 31.5–35.7)
MCV: 89 fL (ref 79–97)
Platelets: 275 10*3/uL (ref 150–379)
RBC: 4.49 x10E6/uL (ref 3.77–5.28)
RDW: 13.6 % (ref 12.3–15.4)
WBC: 8.8 10*3/uL (ref 3.4–10.8)

## 2016-12-06 LAB — TSH: TSH: 0.701 u[IU]/mL (ref 0.450–4.500)

## 2016-12-06 LAB — VITAMIN B12: VITAMIN B 12: 1007 pg/mL (ref 232–1245)

## 2016-12-06 NOTE — Telephone Encounter (Signed)
Requesting lab results. Please call pt back.  

## 2016-12-08 DIAGNOSIS — R5382 Chronic fatigue, unspecified: Secondary | ICD-10-CM | POA: Insufficient documentation

## 2016-12-08 NOTE — Assessment & Plan Note (Signed)
Assessment:  Fatigue Patient reports a several month history of fatigue, related to the onset of her menstrual cycle that lasts for two weeks.  She has associated symptoms of dizziness.  Will obtain a CBC, TSH and vitamin B 12.  Orthostatics in the office were negative.  Plan -TSH -CBC -Vitamin B12 - Orthostatics   Addendum:  Blood work within normal limits

## 2016-12-09 NOTE — Progress Notes (Signed)
Internal Medicine Clinic Attending  Case discussed with Dr. Hoffman at the time of the visit.  We reviewed the resident's history and exam and pertinent patient test results.  I agree with the assessment, diagnosis, and plan of care documented in the resident's note.  

## 2017-07-15 ENCOUNTER — Encounter: Payer: Self-pay | Admitting: Internal Medicine

## 2017-07-15 ENCOUNTER — Ambulatory Visit: Payer: Medicaid Other | Admitting: Internal Medicine

## 2017-07-15 VITALS — BP 120/70 | HR 92 | Temp 98.2°F | Wt 160.2 lb

## 2017-07-15 DIAGNOSIS — R002 Palpitations: Secondary | ICD-10-CM | POA: Insufficient documentation

## 2017-07-15 NOTE — Assessment & Plan Note (Signed)
Patient presents today for evaluation of intermittent palpitations. She states that for the past two years she has had episodes of short-lived palpitations. She states that they typically last 1 to 2 beats. These episodes are rare and occur sporadically. She states that in the past six months that she has had one episode. Her concern is that she is starting to workout and wants to know that her heart is healthy enough for exercise. She was evaluated in 2017 by her prior PCP for palpitations at which point an EKG, TSH and echocardiogram were obtained. All of which were normal. Her palpitations occur both with rest and with exercise. She has never experienced loss of consciousness while working out. She denies a family history of sudden cardiac death or unexplained death. Denies a family history of early heart attacks. She denies a history of congenital heart disease.  On physical exam the patient is resting comfortably with regular rate and rhythm and a normal S1/S2. No murmurs are appreciated. Normal physiologic splitting the S2 was noted.  I discussed with the patient that she is likely experiencing PVCs which can be normal. Based on the patient's history and records it does not appear that she is structural heart disease therefore I do not feel that we need to pursue additional imaging or cardiac monitoring. She denies symptoms of hypothyroidism and therefore I do not feel a TSH is warranted. We discussed methods to decrease the frequency of palpitations including limiting caffeine, alcohol, chocolate, and over-the-counter decongestants. We discussed return precautions including syncope during exercise, increased frequency and duration of palpitations. She was provided with some educational information and reassured. She will follow up if if her symptoms worsened.

## 2017-07-15 NOTE — Patient Instructions (Signed)
Thank you for allowing us to provide your care. Based on available information you are in good health and may start exercising. Palpitations can be normal but if you notice they are increasing in frequency please come back for further evaluation. Other symptoms to watch for is feeling like you may pass-out while working out.   Palpitations A palpitation is the feeling that your heartbeat is irregular or is faster than normal. It may feel like your heart is fluttering or skipping a beat. Palpitations are usually not a serious problem. They may be caused by many things, including smoking, caffeine, alcohol, stress, and certain medicines. Although most causes of palpitations are not serious, palpitations can be a sign of a serious medical problem. In some cases, you may need further medical evaluation. Follow these instructions at home: Pay attention to any changes in your symptoms. Take these actions to help with your condition:  Avoid the following: ? Caffeinated coffee, tea, soft drinks, diet pills, and energy drinks. ? Chocolate. ? Alcohol.  Do not use any tobacco products, such as cigarettes, chewing tobacco, and e-cigarettes. If you need help quitting, ask your health care provider.  Try to reduce your stress and anxiety. Things that can help you relax include: ? Yoga. ? Meditation. ? Physical activity, such as swimming, jogging, or walking. ? Biofeedback. This is a method that helps you learn to use your mind to control things in your body, such as your heartbeats.  Get plenty of rest and sleep.  Take over-the-counter and prescription medicines only as told by your health care provider.  Keep all follow-up visits as told by your health care provider. This is important.  Contact a health care provider if:  You continue to have a fast or irregular heartbeat after 24 hours.  Your palpitations occur more often. Get help right away if:  You have chest pain or shortness of  breath.  You have a severe headache.  You feel dizzy or you faint. This information is not intended to replace advice given to you by your health care provider. Make sure you discuss any questions you have with your health care provider. Document Released: 04/19/2000 Document Revised: 09/25/2015 Document Reviewed: 01/05/2015 Elsevier Interactive Patient Education  [employer]2018 [employer]

## 2017-07-15 NOTE — Progress Notes (Signed)
   CC: Palpitations  HPI:  [name] is a 42 y.o. female who presented to the clinic for evaluation of periodic palpitations. For detailed evaluation and management plan please refer to problem-based charting below.  Past Medical History:  Diagnosis Date  . Hypotension   . Irritable bowel syndrome (IBS)    constipation predominate   Review of Systems:   Denies chest pain, shortness of breath Denies vision changes, syncope, dizziness  Physical Exam: Vitals:   07/15/17 0925  BP: 120/70  Pulse: 92  Temp: 98.2 F (36.8 C)  TempSrc: Oral  SpO2: 99%  Weight: 160 lb 3.2 oz (72.7 kg)   General: Well nourished female in no acute distress Pulm: Good air movement with no wheezing or crackles  CV: RRR, no murmurs, no rubs  Extremities: No LE edema  Neuro: Alert and oriented x 3  Assessment & Plan:   See Encounters Tab for problem based charting.  Patient discussed with Dr. Rogelia BogaButcher

## 2017-07-18 NOTE — Progress Notes (Signed)
Internal Medicine Clinic Attending  Case discussed with Dr. Helberg at the time of the visit.  We reviewed the resident's history and exam and pertinent patient test results.  I agree with the assessment, diagnosis, and plan of care documented in the resident's note.    

## 2017-10-13 NOTE — Progress Notes (Deleted)
   CC: ***  HPI:  Terri Cooper is a 42 y.o.   Past Medical History:  Diagnosis Date  . Hypotension   . Irritable bowel syndrome (IBS)    constipation predominate    Review of Systems:  ***  Physical Exam:  There were no vitals filed for this visit. ***  Assessment & Plan:   See encounters tab for problem based medical decision making.   Patient {GC/GE:[id number]::"discussed with","seen with"} Dr. {NAMES:[id number]::"Butcher","Granfortuna","E. Dimond Crotty","Klima","Mullen","Narendra","Vincent"}

## 2017-10-16 ENCOUNTER — Encounter: Payer: Medicaid Other | Admitting: Internal Medicine

## 2018-11-02 ENCOUNTER — Encounter: Payer: Self-pay | Admitting: *Deleted

## 2019-01-06 ENCOUNTER — Other Ambulatory Visit: Payer: Self-pay | Admitting: Internal Medicine

## 2019-01-06 DIAGNOSIS — Z1231 Encounter for screening mammogram for malignant neoplasm of breast: Secondary | ICD-10-CM

## 2019-01-13 ENCOUNTER — Other Ambulatory Visit (HOSPITAL_COMMUNITY): Payer: Self-pay | Admitting: *Deleted

## 2019-01-13 DIAGNOSIS — Z1231 Encounter for screening mammogram for malignant neoplasm of breast: Secondary | ICD-10-CM

## 2019-03-25 ENCOUNTER — Encounter (HOSPITAL_COMMUNITY): Payer: Self-pay

## 2019-03-25 ENCOUNTER — Other Ambulatory Visit: Payer: Self-pay

## 2019-03-25 ENCOUNTER — Ambulatory Visit (HOSPITAL_COMMUNITY)
Admission: RE | Admit: 2019-03-25 | Discharge: 2019-03-25 | Disposition: A | Discharge status: Home / Self Care | Payer: Medicaid Other | Source: Ambulatory Visit | Attending: Obstetrics and Gynecology | Admitting: Obstetrics and Gynecology

## 2019-03-25 ENCOUNTER — Ambulatory Visit
Admission: RE | Admit: 2019-03-25 | Discharge: 2019-03-25 | Disposition: A | Discharge status: Home / Self Care | Payer: Medicaid Other | Source: Ambulatory Visit | Attending: Obstetrics and Gynecology | Admitting: Obstetrics and Gynecology

## 2019-03-25 DIAGNOSIS — Z1231 Encounter for screening mammogram for malignant neoplasm of breast: Secondary | ICD-10-CM

## 2019-03-25 DIAGNOSIS — Z01419 Encounter for gynecological examination (general) (routine) without abnormal findings: Secondary | ICD-10-CM | POA: Insufficient documentation

## 2019-03-25 NOTE — Progress Notes (Signed)
No complaints today.   Pap Smear: Pap smear completed today. Last Pap smear was in 2017 at Dr. Marcheta Grammes office and normal per patient. Per patient has a history of an abnormal Pap smear in 2008 that a repeat Pap smear was completed. Per patient has had at least three normal Pap smears since abnormal. No Pap smear results are in Epic.  Physical exam: Breasts Breasts symmetrical. No skin abnormalities bilateral breasts. No nipple retraction bilateral breasts. No nipple discharge bilateral breasts. No lymphadenopathy. No lumps palpated bilateral breasts. No complaints of pain or tenderness on exam. Referred patient to the [facility] for a screening mammogram. Appointment scheduled for Thursday, March 25, 2019 at 1540.        Pelvic/Bimanual   Ext Genitalia No lesions, no swelling and no discharge observed on external genitalia.         Vagina Vagina pink and normal texture. No lesions or discharge observed in vagina.          Cervix Cervix is present. Cervix pink and of normal texture. No discharge observed.     Uterus Uterus is present and palpable. Uterus in normal position and normal size.        Adnexae Bilateral ovaries present and palpable. No tenderness on palpation.         Rectovaginal No rectal exam completed today since patient had no rectal complaints. No skin abnormalities observed on exam.    Smoking History: Patient has never smoked.  Patient Navigation: Patient education provided. Access to services provided for patient through [facility] program.   Breast and Cervical Cancer Risk Assessment: Patient has a family history of her maternal grandmother having breast cancer. Patient has no known genetic mutations or history of radiation treatment to the chest before age 39. Patient has no history of cervical dysplasia, immunocompromised, or DES exposure in-utero.  Risk Assessment    Risk Scores      03/25/2019   Last edited by: Loletta Parish,  RN   5-year risk: 0.4 %   Lifetime risk: 7.5 %

## 2019-03-25 NOTE — Patient Instructions (Signed)
Explained breast self awareness with Leigh Aurora. Let patient know [facility] will cover Pap smears and HPV typing every 5 years unless has a history of abnormal Pap smears. Referred patient to the [facility] for a screening mammogram. Appointment scheduled for Thursday, March 25, 2019 at 1540.    Patient aware of appointment and will be there. Let patient know will follow up with her within the next couple weeks with results of Pap smear by letter or phone. Informed patient that [facility] will follow-up with her within the next couple of weeks with results of mammogram by letter or phone. Leigh Aurora verbalized understanding.  Terri Cooper, Arvil Chaco, RN 2:21 PM

## 2019-03-29 LAB — CYTOLOGY - PAP
Comment: NEGATIVE
Diagnosis: NEGATIVE
High risk HPV: NEGATIVE

## 2019-04-19 ENCOUNTER — Telehealth (HOSPITAL_COMMUNITY): Payer: Self-pay | Admitting: *Deleted

## 2019-04-19 NOTE — Telephone Encounter (Signed)
Normal Pap smear result letter mailed to patient by Cytology. 

## 2021-03-14 ENCOUNTER — Encounter (HOSPITAL_COMMUNITY): Payer: Self-pay

## 2021-03-16 ENCOUNTER — Other Ambulatory Visit: Payer: Self-pay | Admitting: Internal Medicine

## 2021-03-16 ENCOUNTER — Other Ambulatory Visit: Payer: Self-pay | Admitting: Family Medicine

## 2021-03-16 DIAGNOSIS — Z1231 Encounter for screening mammogram for malignant neoplasm of breast: Secondary | ICD-10-CM

## 2021-03-20 ENCOUNTER — Ambulatory Visit: Payer: Medicaid Other | Admitting: Internal Medicine

## 2021-03-20 ENCOUNTER — Other Ambulatory Visit: Payer: Self-pay

## 2021-03-20 VITALS — BP 112/69 | HR 81 | Temp 98.3°F | Ht 61.0 in | Wt 149.7 lb

## 2021-03-20 DIAGNOSIS — Z0000 Encounter for general adult medical examination without abnormal findings: Secondary | ICD-10-CM

## 2021-03-20 DIAGNOSIS — R42 Dizziness and giddiness: Secondary | ICD-10-CM | POA: Diagnosis not present

## 2021-03-20 DIAGNOSIS — E663 Overweight: Secondary | ICD-10-CM | POA: Diagnosis not present

## 2021-03-20 DIAGNOSIS — E559 Vitamin D deficiency, unspecified: Secondary | ICD-10-CM | POA: Diagnosis not present

## 2021-03-20 DIAGNOSIS — I950 Idiopathic hypotension: Secondary | ICD-10-CM

## 2021-03-20 DIAGNOSIS — R5382 Chronic fatigue, unspecified: Secondary | ICD-10-CM | POA: Diagnosis not present

## 2021-03-20 DIAGNOSIS — I959 Hypotension, unspecified: Secondary | ICD-10-CM | POA: Insufficient documentation

## 2021-03-20 NOTE — Assessment & Plan Note (Signed)
-   Pap smear due in 2025. Results in 2020 with negative cytology and HPV - Mammogram scheduled for 04/17/2021 - Patient states she has been check for Hep C and HIV; will try to locate those records.

## 2021-03-20 NOTE — Progress Notes (Addendum)
CC: Low blood pressure; fatigue; to re-establish care  HPI:  Terri Cooper is a 45 y.o. with a PMHx as listed below who presents to the clinic for Low blood pressure; fatigue; to re-establish care.   Please see the Encounters tab for problem-based Assessment & Plan regarding status of patient's acute and chronic conditions.  Past Medical History: Past Medical History:  Diagnosis Date   Hypotension    Irritable bowel syndrome (IBS)    constipation predominate   Past Surgical History:  Past Surgical History:  Procedure Laterality Date   CERVICAL CERCLAGE     TUBAL LIGATION     Family History:  Family History  Problem Relation Age of Onset   Diabetes Mother    Hypertension Father    Cancer Maternal Grandmother    Breast cancer Maternal Grandmother    Social History:  Lives in [place] with her husband and two children. She denies any tobacco, alcohol or drug use.  Independent in all ADLs and IADLs   Review of Systems: Review of Systems  Constitutional:  Positive for malaise/fatigue. Negative for chills, fever and weight loss.  HENT:  Negative for congestion and sore throat.   Respiratory:  Negative for cough and shortness of breath.   Cardiovascular:  Negative for chest pain and palpitations.  Gastrointestinal:  Negative for abdominal pain, diarrhea, nausea and vomiting.  Musculoskeletal:  Negative for joint pain and myalgias.  Neurological:  Positive for dizziness. Negative for focal weakness, weakness and headaches.  Psychiatric/Behavioral:  Negative for depression. The patient is not nervous/anxious.    Physical Exam:  Vitals:   03/20/21 0922  BP: 112/69  Pulse: 81  Temp: 98.3 F (36.8 C)  TempSrc: Oral  SpO2: 100%  Weight: 149 lb 11.2 oz (67.9 kg)  Height: 5\' 1"  (1.549 m)   Physical Exam Vitals and nursing note reviewed.  Constitutional:      General: She is not in acute distress.    Appearance: She is overweight.  HENT:     Head:  Normocephalic and atraumatic.  Eyes:     Extraocular Movements: Extraocular movements intact.     Right eye: Normal extraocular motion and no nystagmus.     Left eye: Normal extraocular motion and no nystagmus.     Conjunctiva/sclera: Conjunctivae normal.     Pupils: Pupils are equal, round, and reactive to light.  Cardiovascular:     Rate and Rhythm: Normal rate and regular rhythm.     Heart sounds: No murmur heard. Pulmonary:     Effort: Pulmonary effort is normal. No respiratory distress.     Breath sounds: Normal breath sounds. No wheezing, rhonchi or rales.  Abdominal:     General: Bowel sounds are normal. There is no distension.     Palpations: Abdomen is soft. There is no mass.     Tenderness: There is no abdominal tenderness. There is no guarding.  Musculoskeletal:     Right lower leg: No edema.     Left lower leg: No edema.  Skin:    General: Skin is warm and dry.  Neurological:     General: No focal deficit present.     Mental Status: She is alert and oriented to person, place, and time. Mental status is at baseline.     Sensory: No sensory deficit.     Motor: No weakness.     Gait: Gait normal.     Deep Tendon Reflexes: Reflexes normal.  Psychiatric:  Mood and Affect: Mood normal.        Behavior: Behavior normal.        Thought Content: Thought content normal.        Judgment: Judgment normal.    Assessment & Plan:   See Encounters Tab for problem based charting.  Patient discussed with Dr. Mikey Bussing

## 2021-03-20 NOTE — Assessment & Plan Note (Signed)
-   Will check a lipid panel and BMP given elevated BMI 28.2.

## 2021-03-20 NOTE — Patient Instructions (Addendum)
It was nice seeing you today! Thank you for choosing [facility] for your Primary Care.    Today we talked about:   Low blood pressure (hypotension): Today, your blood pressure is great!  Keep a log at home of your blood pressure when you are feeling dizzy. This can help [place] determine the best next steps.  If needed, we can potentially consider salt tablets in the future  Blood work: We will check on your Vitamin D, TSH, blood counts, kidneys, electrolytes and cholesterol   Follow up based on lab results.

## 2021-03-20 NOTE — Assessment & Plan Note (Signed)
Per chart review, patient has a previous history of hypotension for which she was symptomatic at times with dizziness.  Today, her blood pressure is well controlled and patient denies any dizziness.  - Recommended a blood pressure log to document any hypotensive episodes

## 2021-03-20 NOTE — Assessment & Plan Note (Addendum)
Terri Cooper has a longstanding history of dizziness that she states now mainly occurs when she is on her menstrual cycle.  The dizziness seems to accompany position changes which she feels are secondary to orthostasis.  She notes a longstanding history of low blood pressure and has been working hard to ensure adequate water intake and salt intake.  Terri Cooper notes difficulty maintaining a higher salt diet though as many salty foods are unhealthy.  She finds it protein helps her dizziness the most.  Assessment/plan: Blood pressure within normal limits today.  We will check a CBC to rule out anemia.  - Recommend a blood pressure log to evaluate dizziness secondary to hypotension - Given patient is difficulty with maintaining a higher salt diet, could consider salt tablets if hypotensive during menstrual cycle - CBC pending

## 2021-03-20 NOTE — Assessment & Plan Note (Signed)
Terri Cooper states that she continues to have chronic fatigue that has been present for several years now.  She has worked very hard on ensuring her mental health is taking care of in addition to eating a healthy diet and attempting to exercise is much as possible.  She understands that a holistic approach is necessary for fatigue.  She denies any unintentional weight loss.  She notes symptoms seem to worsen during her menstrual cycle, however denies any past medical history of anemia.  She endorses a past medical history of vitamin D deficiency but is currently taking vitamin D daily.  Assessment/plan: Previous work-up with TSH, CBC, vitamin B12 and orthostatics were negative in 2018.  We will recheck her CBC, vitamin D and TSH today given symptoms have persisted.  We will also obtain a BMP to assess electrolytes.  - Encouraged Terri Cooper to continue working on mental health, exercise and diet - CBC, vitamin D, TSH, BMP pending

## 2021-03-21 NOTE — Progress Notes (Signed)
TSH, CBC, Vitamin D and BMP are unremarkable. Recommended continued Vitamin D supplementation. Total cholesterol and LDL are above normal. Patient contacted via telephone and we discussed adjustments in diet. No indication for statin therapy.

## 2021-03-22 LAB — BMP8+ANION GAP
Anion Gap: 14 mmol/L (ref 10.0–18.0)
BUN/Creatinine Ratio: 16 (ref 9–23)
BUN: 14 mg/dL (ref 6–24)
CO2: 25 mmol/L (ref 20–29)
Calcium: 9.7 mg/dL (ref 8.7–10.2)
Chloride: 103 mmol/L (ref 96–106)
Creatinine, Ser: 0.9 mg/dL (ref 0.57–1.00)
Glucose: 86 mg/dL (ref 70–99)
Potassium: 4.7 mmol/L (ref 3.5–5.2)
Sodium: 142 mmol/L (ref 134–144)
eGFR: 82 mL/min/{1.73_m2} (ref >59)

## 2021-03-22 LAB — CBC
Hematocrit: 40.2 % (ref 34.0–46.6)
Hemoglobin: 13.5 g/dL (ref 11.1–15.9)
MCH: 29.7 pg (ref 26.6–33.0)
MCHC: 33.6 g/dL (ref 31.5–35.7)
MCV: 89 fL (ref 79–97)
Platelets: 256 10*3/uL (ref 150–450)
RBC: 4.54 x10E6/uL (ref 3.77–5.28)
RDW: 13.1 % (ref 11.7–15.4)
WBC: 5.8 10*3/uL (ref 3.4–10.8)

## 2021-03-22 LAB — LIPID PANEL
Chol/HDL Ratio: 4 ratio (ref 0.0–4.4)
Cholesterol, Total: 201 mg/dL — ABNORMAL HIGH (ref 100–199)
HDL: 50 mg/dL (ref >39)
LDL Chol Calc (NIH): 132 mg/dL — ABNORMAL HIGH (ref 0–99)
Triglycerides: 107 mg/dL (ref 0–149)
VLDL Cholesterol Cal: 19 mg/dL (ref 5–40)

## 2021-03-22 LAB — TSH: TSH: 1.19 u[IU]/mL (ref 0.450–4.500)

## 2021-03-22 LAB — VITAMIN D 25 HYDROXY (VIT D DEFICIENCY, FRACTURES): Vit D, 25-Hydroxy: 39.6 ng/mL (ref 30.0–100.0)

## 2021-03-23 NOTE — Progress Notes (Signed)
Internal Medicine Clinic Attending  Case discussed with Dr. Basaraba  At the time of the visit.  We reviewed the resident's history and exam and pertinent patient test results.  I agree with the assessment, diagnosis, and plan of care documented in the resident's note.  

## 2021-04-17 ENCOUNTER — Ambulatory Visit
Admission: RE | Admit: 2021-04-17 | Discharge: 2021-04-17 | Disposition: A | Discharge status: Home / Self Care | Payer: Medicaid Other | Source: Ambulatory Visit

## 2021-04-17 ENCOUNTER — Other Ambulatory Visit: Payer: Self-pay

## 2021-04-17 DIAGNOSIS — Z1231 Encounter for screening mammogram for malignant neoplasm of breast: Secondary | ICD-10-CM

## 2021-04-25 DIAGNOSIS — H5213 Myopia, bilateral: Secondary | ICD-10-CM | POA: Diagnosis not present

## 2021-06-01 ENCOUNTER — Ambulatory Visit: Payer: Self-pay | Admitting: Nurse Practitioner

## 2021-06-19 DIAGNOSIS — H524 Presbyopia: Secondary | ICD-10-CM | POA: Diagnosis not present

## 2022-02-19 ENCOUNTER — Other Ambulatory Visit: Payer: Self-pay | Admitting: Internal Medicine

## 2022-02-19 DIAGNOSIS — Z1231 Encounter for screening mammogram for malignant neoplasm of breast: Secondary | ICD-10-CM

## 2022-03-05 ENCOUNTER — Encounter: Payer: Self-pay | Admitting: Nurse Practitioner

## 2022-04-19 ENCOUNTER — Ambulatory Visit
Admission: RE | Admit: 2022-04-19 | Discharge: 2022-04-19 | Disposition: A | Discharge status: Home / Self Care | Payer: Medicaid Other | Source: Ambulatory Visit

## 2022-04-19 DIAGNOSIS — Z1231 Encounter for screening mammogram for malignant neoplasm of breast: Secondary | ICD-10-CM

## 2023-02-11 ENCOUNTER — Other Ambulatory Visit: Payer: Self-pay

## 2023-02-11 ENCOUNTER — Ambulatory Visit: Payer: Medicaid Other | Admitting: Student

## 2023-02-11 VITALS — BP 113/65 | HR 83 | Temp 98.1°F | Ht 62.5 in | Wt 147.7 lb

## 2023-02-11 DIAGNOSIS — Z8639 Personal history of other endocrine, nutritional and metabolic disease: Secondary | ICD-10-CM | POA: Insufficient documentation

## 2023-02-11 DIAGNOSIS — E663 Overweight: Secondary | ICD-10-CM

## 2023-02-11 DIAGNOSIS — Z0000 Encounter for general adult medical examination without abnormal findings: Secondary | ICD-10-CM

## 2023-02-11 DIAGNOSIS — Z6826 Body mass index (BMI) 26.0-26.9, adult: Secondary | ICD-10-CM | POA: Diagnosis not present

## 2023-02-11 DIAGNOSIS — E785 Hyperlipidemia, unspecified: Secondary | ICD-10-CM

## 2023-02-11 NOTE — Assessment & Plan Note (Signed)
Patient endorses a history of vitamin D deficiency.  Last labs were low normal in 2022.  She says she has not been taking her vitamin D very regularly. Plan: Repeat vitamin D level

## 2023-02-11 NOTE — Assessment & Plan Note (Addendum)
Last Pap: NILM, HPV negative Nov 2020  - Due November 2025, will follow-up with [place] 1 year for Pap. Mammogram: BI-RADS-1 -  Due for repeat in December patient will schedule. Colonoscopy: Patient turning 47 in December, referral placed for colonoscopy then. TDAP/flu: declines for now Lipid: Slight hyperlipidemia last visit, will follow-up upon this with repeat lipid panel today. HIV/hep C screening: Will conduct one-time HIV/hep C screening.

## 2023-02-11 NOTE — Patient Instructions (Signed)
Thank you, [name] for allowing [place] to provide your care today.  I have ordered the following tests for you:  Lab Orders         Vitamin D (25 hydroxy)         Lipid Profile         HIV antibody (with reflex)         Hepatitis C Ab reflex to Quant PCR       Referrals ordered today:   Referral Orders         Ambulatory referral to Gastroenterology           Follow up: 1 year    We look forward to seeing you next time. Please call our clinic at [phone] if you have any questions or concerns. The best time to call is Monday-Friday from 9am-4pm, but there is someone available 24/7. If after hours or the weekend, call the main hospital number and ask for the Internal Medicine Resident On-Call. If you need medication refills, please notify your pharmacy one week in advance and they will send [place] a request.   Thank you for trusting me with your care. Wishing you the best!  Lovie Macadamia MD [facility]

## 2023-02-11 NOTE — Assessment & Plan Note (Signed)
Patient is working on making lifestyle changes in order to the live a more healthy life.  Her weight has been stable since 3 years ago, and she has lost a few pounds in the last month since beginning a new program.  We discussed extensively recommendations for making lifestyle modifications.  As far as her diet and exercise goes I think she is doing a good job of avoiding liquid calories and processed foods/foods with excessive added sugars.  She is walking regularly which is also good.  She is working on increasing her protein and cutting down on her carbs. Plan: Recommend to continue with her lifestyle modifications. Discussed that weight loss is a long process, scale weights can fluctuate with many factors and seeing results will be on the scale of months. Would be happy for the patient to lose 1 to 2 pounds a month, would not want him to lose much more than Recommended that patient may add some resistance or weightbearing exercises for prevention of sarcopenia and osteoporosis.

## 2023-02-11 NOTE — Progress Notes (Signed)
    Subjective:  CC: Reestablish care, health maintenance  HPI:  [name] is a 47 y.o. person with a past medical history stated below and presents today for reestablish care and health maintenance. Please see problem based assessment and plan for additional details.  Past Medical History:  Diagnosis Date   Hypotension    Irritable bowel syndrome (IBS)    constipation predominate    Current Outpatient Medications on File Prior to Visit  Medication Sig Dispense Refill   ibuprofen (ADVIL,MOTRIN) 200 MG tablet Take 200-400 mg by mouth every 6 (six) hours as needed for headache (or pain).      No current facility-administered medications on file prior to visit.    Review of Systems: Please see assessment and plan for pertinent positives and negatives.  Objective:   Vitals:   02/11/23 1018  BP: 113/65  Pulse: 83  Temp: 98.1 F (36.7 C)  TempSrc: Oral  SpO2: 99%  Weight: 147 lb 11.2 oz (67 kg)  Height: 5' 2.5" (1.588 m)    Physical Exam: Constitutional: Well-appearing, in no acute distress Cardiovascular: regular rate and rhythm, no m/r/g Pulmonary/Chest: normal work of breathing on room air, lungs clear to auscultation bilaterally Abdominal: soft, non-tender, non-distended Extremities: No edema of the lower extremities bilaterally Skin: warm and dry Psych: Pleasant mood and affect   Assessment & Plan:  History of vitamin D deficiency Patient endorses a history of vitamin D deficiency.  Last labs were low normal in 2022.  She says she has not been taking her vitamin D very regularly. Plan: Repeat vitamin D level   Healthcare maintenance Last Pap: NILM, HPV negative Nov 2020  - Due November 2025, will follow-up with [place] 1 year for Pap. Mammogram: BI-RADS-1 -  Due for repeat in December patient will schedule. Colonoscopy: Patient turning 47 in December, referral placed for colonoscopy then. TDAP/flu: declines for now Lipid: Slight hyperlipidemia last  visit, will follow-up upon this with repeat lipid panel today. HIV/hep C screening: Will conduct one-time HIV/hep C screening.   Overweight (BMI 25.0-29.9) Patient is working on making lifestyle changes in order to the live a more healthy life.  Her weight has been stable since 3 years ago, and she has lost a few pounds in the last month since beginning a new program.  We discussed extensively recommendations for making lifestyle modifications.  As far as her diet and exercise goes I think she is doing a good job of avoiding liquid calories and processed foods/foods with excessive added sugars.  She is walking regularly which is also good.  She is working on increasing her protein and cutting down on her carbs. Plan: Recommend to continue with her lifestyle modifications. Discussed that weight loss is a long process, scale weights can fluctuate with many factors and seeing results will be on the scale of months. Would be happy for the patient to lose 1 to 2 pounds a month, would not want him to lose much more than Recommended that patient may add some resistance or weightbearing exercises for prevention of sarcopenia and osteoporosis.    Patient seen with Dr. Lajean Silvius MD [facility]  PGY-1 Pager: [phone]  Phone: [phone] Date 02/11/2023  Time 1:35 PM

## 2023-02-12 LAB — LIPID PANEL
Chol/HDL Ratio: 3.2 {ratio} (ref 0.0–4.4)
Cholesterol, Total: 165 mg/dL (ref 100–199)
HDL: 52 mg/dL (ref >39)
LDL Chol Calc (NIH): 102 mg/dL — ABNORMAL HIGH (ref 0–99)
Triglycerides: 52 mg/dL (ref 0–149)
VLDL Cholesterol Cal: 11 mg/dL (ref 5–40)

## 2023-02-12 LAB — HCV AB W REFLEX TO QUANT PCR: HCV Ab: NONREACTIVE

## 2023-02-12 LAB — HIV ANTIBODY (ROUTINE TESTING W REFLEX): HIV Screen 4th Generation wRfx: NONREACTIVE

## 2023-02-12 LAB — HCV INTERPRETATION

## 2023-02-12 LAB — VITAMIN D 25 HYDROXY (VIT D DEFICIENCY, FRACTURES): Vit D, 25-Hydroxy: 27.5 ng/mL — ABNORMAL LOW (ref 30.0–100.0)

## 2023-02-13 NOTE — Progress Notes (Signed)
Internal Medicine Clinic Attending  I was physically present during the key portions of the resident provided service and participated in the medical decision making of patient's management care. I reviewed pertinent patient test results.  The assessment, diagnosis, and plan were formulated together and I agree with the documentation in the resident's note.  Social History   Socioeconomic History   Marital status: Married    Spouse name: Not on file   Number of children: Not on file   Years of education: Not on file   Highest education level: Bachelor's degree (e.g., BA, AB, BS)  Occupational History   Not on file  Tobacco Use   Smoking status: Not on file   Smokeless tobacco: Never  Vaping Use   Vaping status: Never Used  Substance and Sexual Activity   Alcohol use: No    Alcohol/week: 0.0 standard drinks of alcohol   Drug use: No   Sexual activity: Yes    Birth control/protection: Surgical  Other Topics Concern   Not on file  Social History Narrative   ** Merged History Encounter **       Social Determinants of Health   Financial Resource Strain: Low Risk  (02/11/2023)   Overall Financial Resource Strain (CARDIA)    Difficulty of Paying Living Expenses: Not hard at all  Food Insecurity: No Food Insecurity (02/11/2023)   Hunger Vital Sign    Worried About Running Out of Food in the Last Year: Never true    Ran Out of Food in the Last Year: Never true  Transportation Needs: No Transportation Needs (02/11/2023)   PRAPARE - Administrator, Civil Service (Medical): No    Lack of Transportation (Non-Medical): No  Physical Activity: Sufficiently Active (02/11/2023)   Exercise Vital Sign    Days of Exercise per Week: 5 days    Minutes of Exercise per Session: 30 min  Stress: No Stress Concern Present (02/11/2023)   [employer] of Occupational Health - Occupational Stress Questionnaire    Feeling of Stress : Not at all  Social Connections: Socially Integrated  (02/11/2023)   Social Connection and Isolation Panel [NHANES]    Frequency of Communication with Friends and Family: More than three times a week    Frequency of Social Gatherings with Friends and Family: Three times a week    Attends Religious Services: More than 4 times per year    Active Member of Clubs or Organizations: Yes    Attends Banker Meetings: More than 4 times per year    Marital Status: Married     [place], Dillard Cannon, MD

## 2023-03-24 ENCOUNTER — Other Ambulatory Visit: Payer: Self-pay | Admitting: Student

## 2023-03-24 DIAGNOSIS — Z1231 Encounter for screening mammogram for malignant neoplasm of breast: Secondary | ICD-10-CM

## 2023-03-26 ENCOUNTER — Other Ambulatory Visit: Payer: Self-pay | Admitting: Internal Medicine

## 2023-03-26 DIAGNOSIS — Z1231 Encounter for screening mammogram for malignant neoplasm of breast: Secondary | ICD-10-CM

## 2023-04-22 ENCOUNTER — Ambulatory Visit
Admission: RE | Admit: 2023-04-22 | Discharge: 2023-04-22 | Disposition: A | Discharge status: Home / Self Care | Payer: Medicaid Other | Source: Ambulatory Visit

## 2023-04-22 DIAGNOSIS — Z1231 Encounter for screening mammogram for malignant neoplasm of breast: Secondary | ICD-10-CM

## 2023-07-05 IMAGING — MG MM DIGITAL SCREENING BILAT W/ TOMO AND CAD
8 series · 9 of 24 positions shown · non-contrast
Comparison: Previous exam(s).

CLINICAL DATA: Screening.

EXAM:
DIGITAL SCREENING BILATERAL MAMMOGRAM WITH TOMOSYNTHESIS AND CAD
TECHNIQUE: Bilateral screening digital craniocaudal and mediolateral oblique
mammograms were obtained. Bilateral screening digital breast
tomosynthesis was performed. The images were evaluated with
computer-aided detection.

[R CC synth-2D]
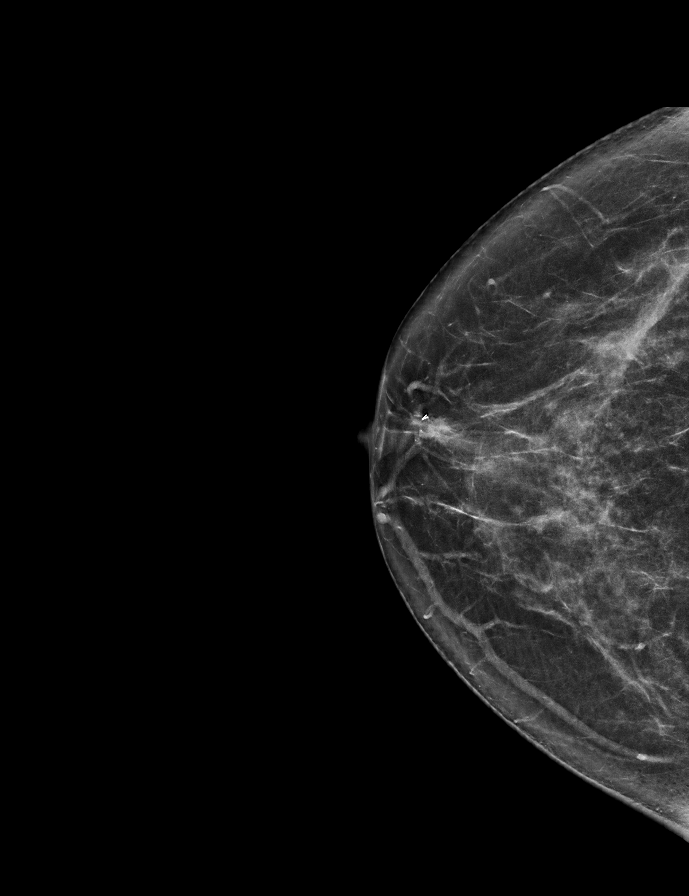

[L MLO synth-2D]
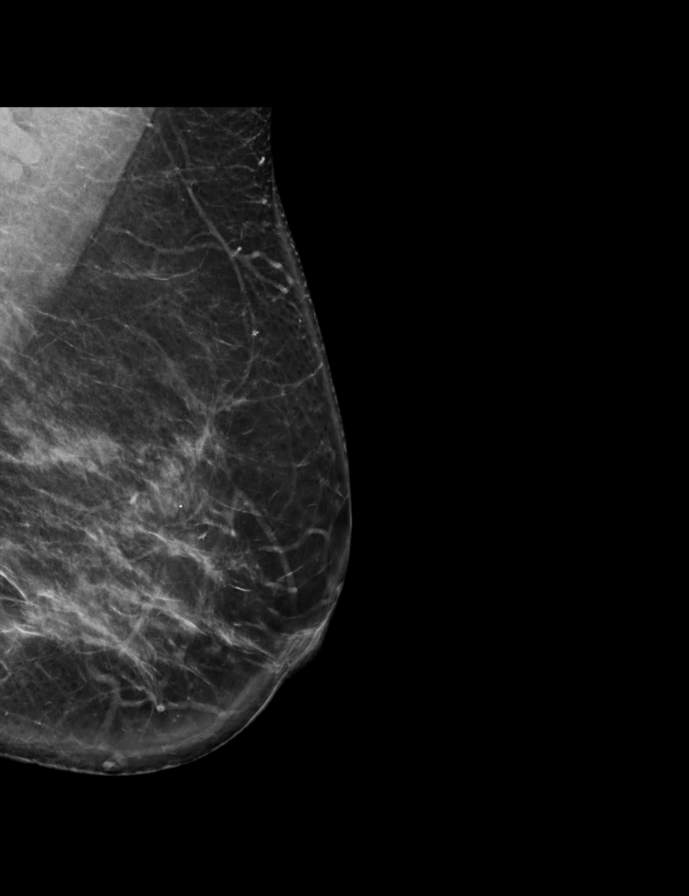

[L CC synth-2D]
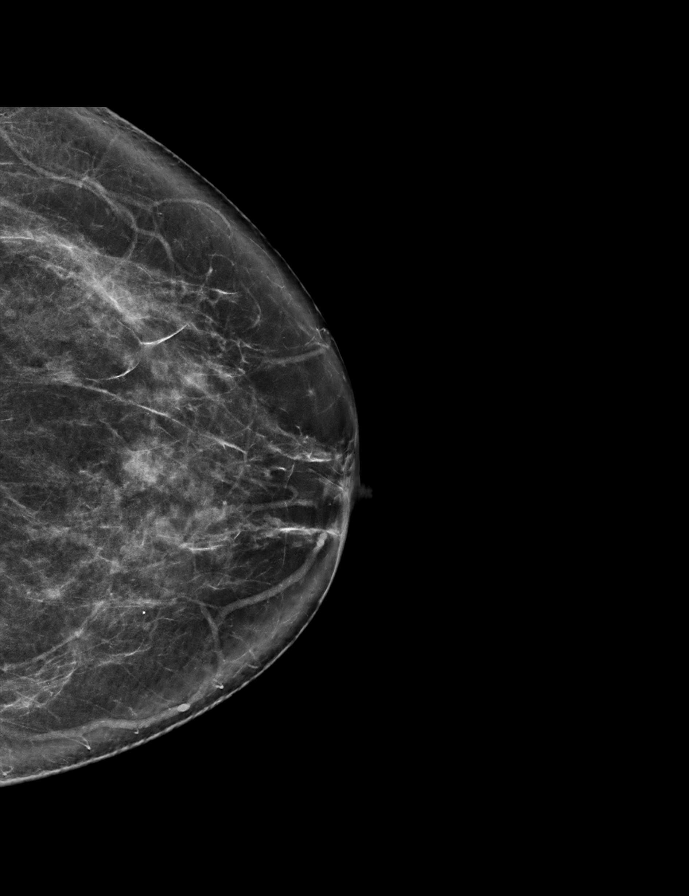

[R MLO synth-2D]
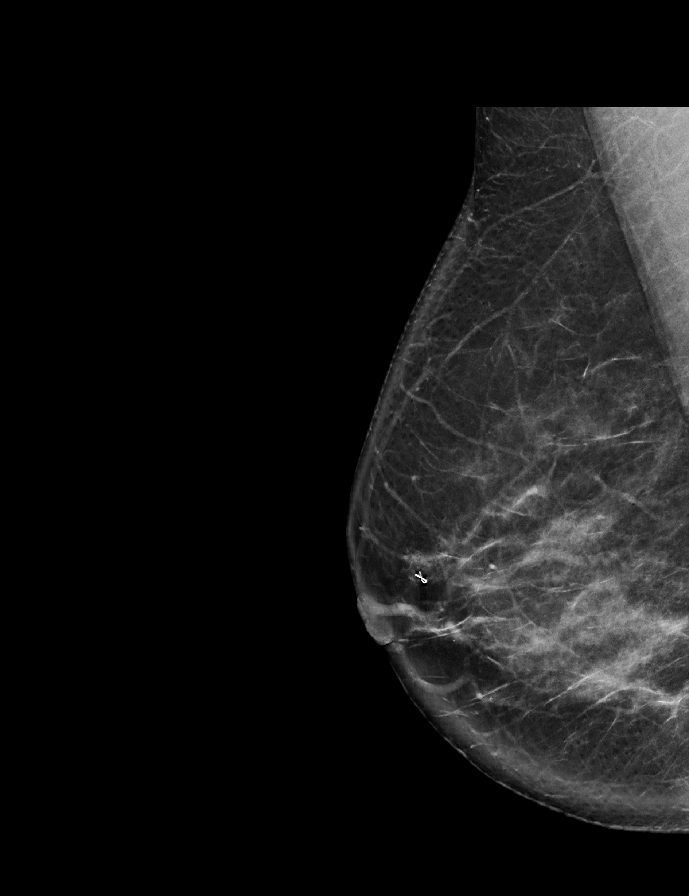

[L MLO tomo · 2 of 83 frames shown]
[frame 27/83]
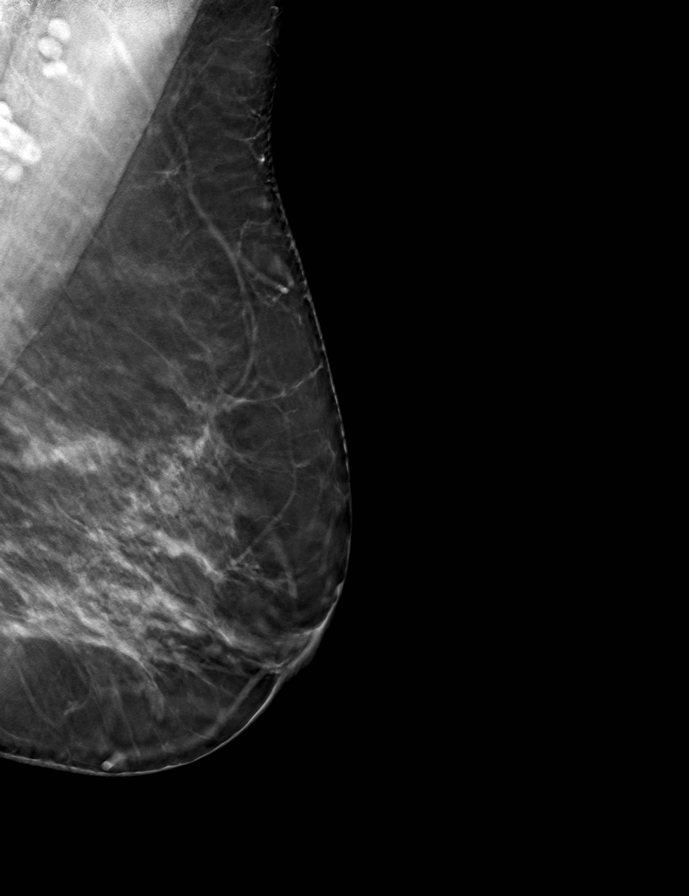
[frame 42/83]
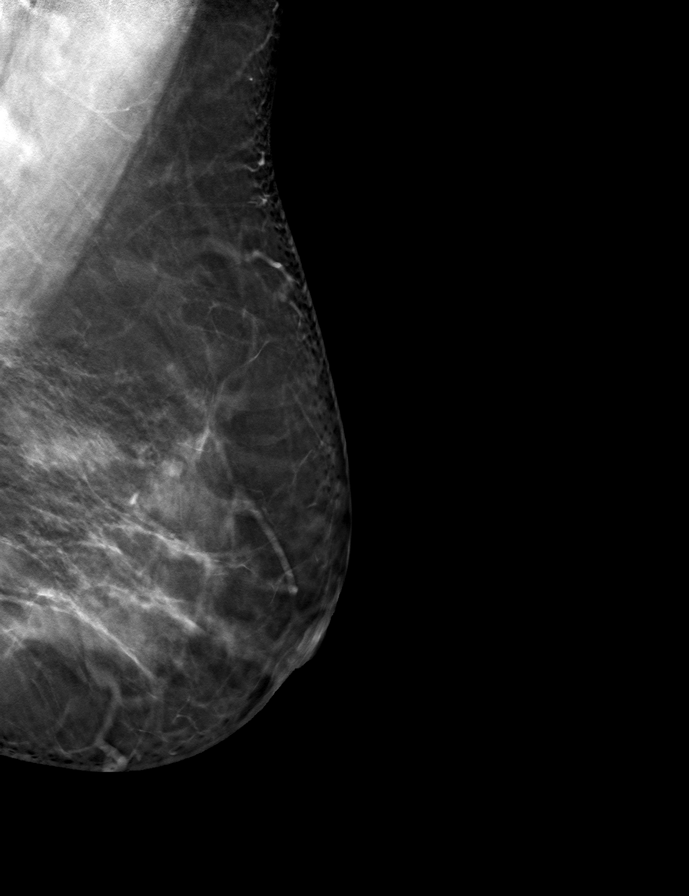

[R CC tomo · tomo slice 35/69.0]
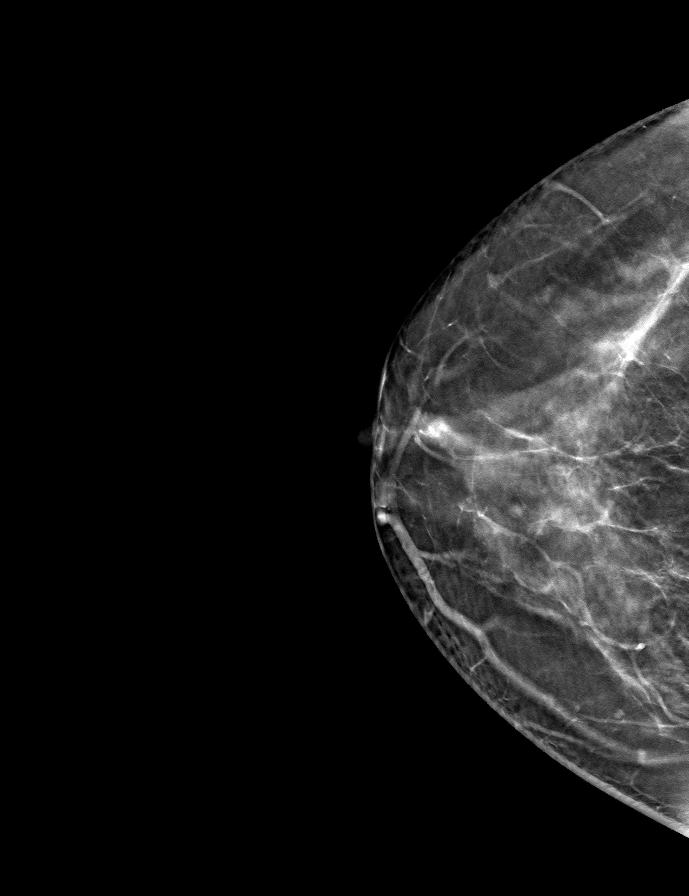

[L CC tomo · tomo slice 38/75.0]
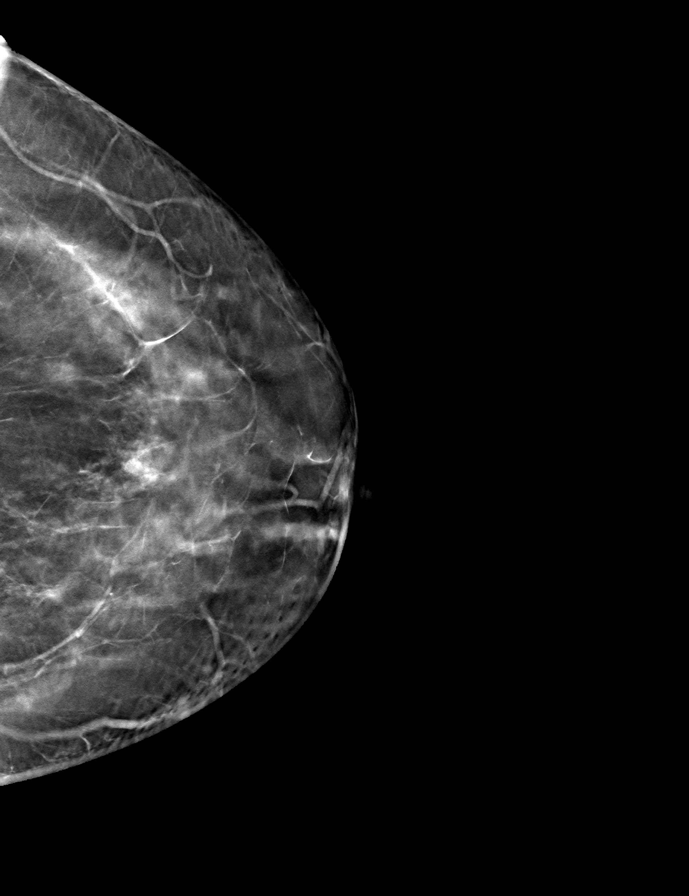

[R MLO tomo · tomo slice 42/83.0]
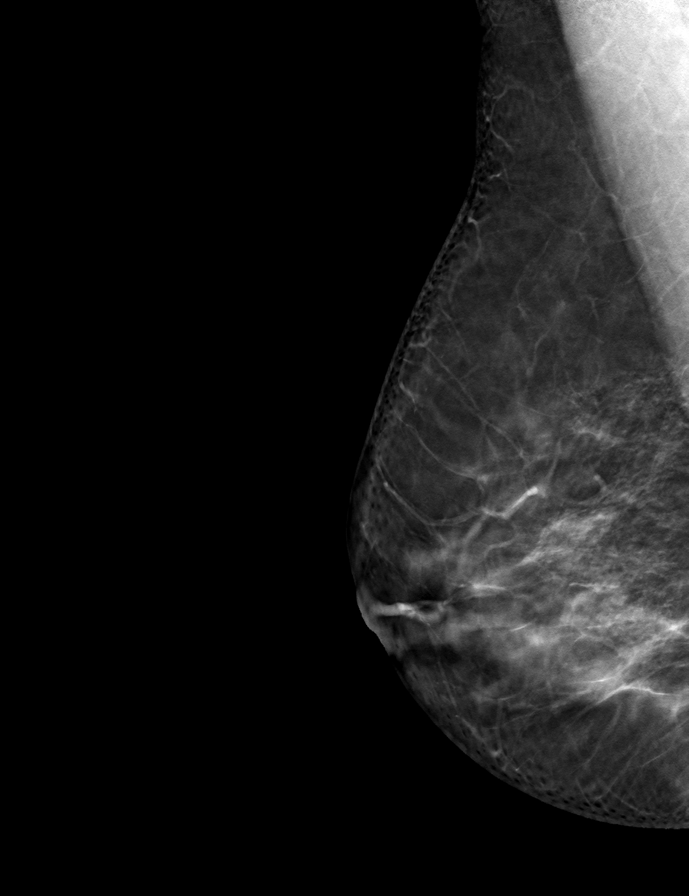

[9 of 24 positions shown; findings below may reference images not displayed]

ACR Breast Density Category b: There are scattered areas of
fibroglandular density.
FINDINGS: There are no findings suspicious for malignancy.
IMPRESSION: No mammographic evidence of malignancy. A result letter of this
screening mammogram will be mailed directly to the patient.

RECOMMENDATION:
Screening mammogram in one year. (Code:51-O-LD2)

BI-RADS CATEGORY  1: Negative.

## 2023-10-20 ENCOUNTER — Encounter: Payer: Self-pay | Admitting: *Deleted

## 2024-03-18 ENCOUNTER — Other Ambulatory Visit (HOSPITAL_COMMUNITY)
Admission: RE | Admit: 2024-03-18 | Discharge: 2024-03-18 | Disposition: A | Discharge status: Home / Self Care | Source: Ambulatory Visit | Attending: Family Medicine | Admitting: Family Medicine

## 2024-03-18 ENCOUNTER — Ambulatory Visit: Payer: Self-pay

## 2024-03-18 VITALS — BP 115/64 | HR 88 | Temp 98.1°F | Ht 62.4 in | Wt 143.8 lb

## 2024-03-18 DIAGNOSIS — E785 Hyperlipidemia, unspecified: Secondary | ICD-10-CM | POA: Diagnosis present

## 2024-03-18 DIAGNOSIS — Z0000 Encounter for general adult medical examination without abnormal findings: Secondary | ICD-10-CM | POA: Insufficient documentation

## 2024-03-18 DIAGNOSIS — N951 Menopausal and female climacteric states: Secondary | ICD-10-CM

## 2024-03-18 DIAGNOSIS — K581 Irritable bowel syndrome with constipation: Secondary | ICD-10-CM | POA: Diagnosis not present

## 2024-03-18 DIAGNOSIS — Z124 Encounter for screening for malignant neoplasm of cervix: Secondary | ICD-10-CM | POA: Insufficient documentation

## 2024-03-18 NOTE — Assessment & Plan Note (Signed)
 Pap smear performed with chaperone present. Vulva normal, no rash present. Internal: vagina normal without evidence of injury or bleeding. Cervix appeared normal without erythema, friability, or bleeding. Brush was used to obtain sample for pap cytology. Patient was counseled that spotting was normal after completion of exam. Patient had no further questions or concerns. Interested in STI screening for chlamydia, gonorrhea, and trichomonas.  - Pap cytology ordered with GC/chlamydia, trichomonas, HPV if ASC-US  with reflex to 16/18 genotyping

## 2024-03-18 NOTE — Assessment & Plan Note (Signed)
 Lipid Panel     Component Value Date/Time   CHOL 165 02/11/2023 1112   TRIG 52 02/11/2023 1112   HDL 52 02/11/2023 1112   CHOLHDL 3.2 02/11/2023 1112   LDLCALC 102 (H) 02/11/2023 1112   LABVLDL 11 02/11/2023 1112  Last lipid panel LDL 102 (2024). Repeat lipid panel ordered. Patient reported that she has been implementing life style changes for her health and has been feeling well. Her goal is to be around 135 pounds and is intermittent fasting (not eating before noon), trying to exercise more, and eating more protein. If lipids remain elevated, given age, counseled patient on need for possible medication management. Patient stated understanding.  - lipid panel ordered - continue lifestyle and diet

## 2024-03-18 NOTE — Assessment & Plan Note (Signed)
 Patient overall feeling and doing well. She is taking excellent control of her health and wanting to stay on top of things for herself and her 2 children (senior and junior in high school).   Colonoscopy: Discussed risks/benefits of colonoscopy vs cologuard.  Patient stated she would prefer colonoscopy. Referral placed, phone number for [facility] provided per last referral. Patient denied blood in stool, melena, unexplained WL, family history of colon cancer, has 1 BM a day, hx of IBS-C well managed by avoiding triggers of foods and decreasing stress. Mammogram: scheduled for December 2025, last mammogram 2024 WNL recommended f/u in 1 year.  Pap smear: last pap smear 2020 WNL. Performed pap smear today (see below) Vaccines: Tdap (interested, did not administer today; will message patient about returning for this vaccine), influenza (not interested) Smoking: does not smoke, never smoked Alcohol: does not drink alcohol Safety: feels safe at home Mental health: During menstrual cycle has PMS symptoms, but patient states she is doing well managing this. Other: Up to date with dentist and eye doctor.

## 2024-03-18 NOTE — Progress Notes (Signed)
 CC: Physical  HPI:  [name] is a 48 y.o. female with pertinent past medical history of chronic fatigue, history of vitamin D  deficiency, hyperlipidemia, hypotension, IBS (further medical history stated below) and presents today for physical. Please see problem based assessment and plan for additional details.  Last clinic appointment: 02/11/2023: Discussed healthcare maintenance.  Patient due for Pap smear 03/2024, mammogram 04/2023 (completed, recommending screening mammogram in 1 year), colonoscopy due 04/2023 (referral placed).  Discussed lifestyle changes.  Lipid profile 02/11/2023: Total cholesterol 165, TG 52, LDL 102, vitamin D  25 hydroxy 27.5, negative HIV, negative hepatitis C.  TSH 2022 WNL Last Pertinent Labs Documented:     Latest Ref Rng & Units 03/20/2021   10:37 AM 03/11/2016    4:44 PM  BMP  Glucose 70 - 99 mg/dL 86  97   BUN 6 - 24 mg/dL 14  10   Creatinine 9.42 - 1.00 mg/dL 9.09  9.20   BUN/Creat Ratio 9 - 23 16    Sodium 134 - 144 mmol/L 142  139   Potassium 3.5 - 5.2 mmol/L 4.7  4.2   Chloride 96 - 106 mmol/L 103  C 104   CO2 20 - 29 mmol/L 25  C 26   Calcium 8.7 - 10.2 mg/dL 9.7  9.3     C Corrected result       Latest Ref Rng & Units 03/20/2021   10:37 AM 12/05/2016    4:15 PM 03/11/2016    4:44 PM  CBC  WBC 3.4 - 10.8 x10E3/uL 5.8  8.8  7.7   Hemoglobin 11.1 - 15.9 g/dL 86.4  86.9  86.1   Hematocrit 34.0 - 46.6 % 40.2  40.1  40.6   Platelets 150 - 450 x10E3/uL 256  275  303     No results found for: HGBA1C   Past Medical History:  Diagnosis Date   Hypotension    Irritable bowel syndrome (IBS)    constipation predominate    Current Outpatient Medications on File Prior to Visit  Medication Sig Dispense Refill   Multiple Vitamin (MULTIVITAMIN WITH MINERALS) TABS tablet Take 1 tablet by mouth daily.     ibuprofen (ADVIL,MOTRIN) 200 MG tablet Take 200-400 mg by mouth every 6 (six) hours as needed for headache (or pain).      No current  facility-administered medications on file prior to visit.    Family History  Problem Relation Age of Onset   Diabetes Mother    Hypertension Father    Cancer Maternal Grandmother    Breast cancer Maternal Grandmother     Social History   Socioeconomic History   Marital status: Married    Spouse name: Not on file   Number of children: Not on file   Years of education: Not on file   Highest education level: Bachelor's degree (e.g., BA, AB, BS)  Occupational History   Not on file  Tobacco Use   Smoking status: Not on file   Smokeless tobacco: Never  Vaping Use   Vaping status: Never Used  Substance and Sexual Activity   Alcohol use: No    Alcohol/week: 0.0 standard drinks of alcohol   Drug use: No   Sexual activity: Yes    Birth control/protection: Surgical  Other Topics Concern   Not on file  Social History Narrative   ** Merged History Encounter **       Social Drivers of Health   Financial Resource Strain: Low Risk  (02/11/2023)  Overall Financial Resource Strain (CARDIA)    Difficulty of Paying Living Expenses: Not hard at all  Food Insecurity: No Food Insecurity (02/11/2023)   Hunger Vital Sign    Worried About Running Out of Food in the Last Year: Never true    Ran Out of Food in the Last Year: Never true  Transportation Needs: No Transportation Needs (02/11/2023)   PRAPARE - Administrator, Civil Service (Medical): No    Lack of Transportation (Non-Medical): No  Physical Activity: Sufficiently Active (02/11/2023)   Exercise Vital Sign    Days of Exercise per Week: 5 days    Minutes of Exercise per Session: 30 min  Stress: No Stress Concern Present (02/11/2023)   [employer] of Occupational Health - Occupational Stress Questionnaire    Feeling of Stress : Not at all  Social Connections: Socially Integrated (02/11/2023)   Social Connection and Isolation Panel    Frequency of Communication with Friends and Family: More than three times a week     Frequency of Social Gatherings with Friends and Family: Three times a week    Attends Religious Services: More than 4 times per year    Active Member of Clubs or Organizations: Yes    Attends Banker Meetings: More than 4 times per year    Marital Status: Married  Catering Manager Violence: Not At Risk (02/11/2023)   Humiliation, Afraid, Rape, and Kick questionnaire    Fear of Current or Ex-Partner: No    Emotionally Abused: No    Physically Abused: No    Sexually Abused: No    Review of Systems: Review of Systems  Constitutional:  Positive for malaise/fatigue (around menstrual cycle). Negative for chills and fever.  Respiratory:  Negative for shortness of breath.   Cardiovascular:  Negative for chest pain.  Gastrointestinal:  Negative for abdominal pain, blood in stool, constipation, heartburn and melena.  Genitourinary:  Negative for dysuria.  Musculoskeletal:  Positive for joint pain (occassional left knee pain.). Negative for myalgias.  Skin:  Positive for rash (left breast heat rash during the summer. gets better with lotion.).  Neurological:  Negative for dizziness and weakness.  Psychiatric/Behavioral:  Negative for depression. The patient is not nervous/anxious and does not have insomnia.      Vitals:   03/18/24 0822  BP: 115/64  Pulse: 88  Temp: 98.1 F (36.7 C)  TempSrc: Oral  SpO2: 100%  Weight: 143 lb 12.8 oz (65.2 kg)  Height: 5' 2.4 (1.585 m)    Physical Exam: Physical Exam Exam conducted with a chaperone present.  Constitutional:      General: She is not in acute distress.    Appearance: Normal appearance.  Cardiovascular:     Rate and Rhythm: Normal rate and regular rhythm.     Heart sounds: Normal heart sounds. No murmur heard. Pulmonary:     Effort: Pulmonary effort is normal.     Breath sounds: Normal breath sounds. No stridor. No wheezing, rhonchi or rales.  Abdominal:     General: Bowel sounds are normal.     Palpations:  Abdomen is soft.     Hernia: A hernia (diastasis recti, non-bothersome per patient) is present.  Genitourinary:    General: Normal vulva.     Pubic Area: No rash.      Vagina: Normal. No signs of injury. No bleeding.     Cervix: Normal. No friability, erythema or cervical bleeding.  Musculoskeletal:     Right lower leg: No  edema.     Left lower leg: No edema.  Skin:    General: Skin is warm and dry.     Findings: No bruising or rash.  Neurological:     Mental Status: She is alert.      Assessment & Plan:   Patient seen with Dr. Shawn Assessment & Plan Healthcare maintenance Patient overall feeling and doing well. She is taking excellent control of her health and wanting to stay on top of things for herself and her 2 children (senior and junior in high school).   Colonoscopy: Discussed risks/benefits of colonoscopy vs cologuard.  Patient stated she would prefer colonoscopy. Referral placed, phone number for [facility] provided per last referral. Patient denied blood in stool, melena, unexplained WL, family history of colon cancer, has 1 BM a day, hx of IBS-C well managed by avoiding triggers of foods and decreasing stress. Mammogram: scheduled for December 2025, last mammogram 2024 WNL recommended f/u in 1 year.  Pap smear: last pap smear 2020 WNL. Performed pap smear today (see below) Vaccines: Tdap (interested, did not administer today; will message patient about returning for this vaccine), influenza (not interested) Smoking: does not smoke, never smoked Alcohol: does not drink alcohol Safety: feels safe at home Mental health: During menstrual cycle has PMS symptoms, but patient states she is doing well managing this. Other: Up to date with dentist and eye doctor.  Peri-menopause Patient stated she believes she could be entering pre-menopause as she is having worsening brain fog and that is bringing her confidence down. Last menstrual period 03/14/2024. Generally her cycles are 28  day. Will occasionally spot, but overall very regular. Interested in seeing OBGYN to establish care during this transition period. Explained to patient that in the meantime, if she has any worrisome or troubling symptoms we could manage until she is seen by OBGYN.  - ambulatory referral placed for OBGYN Screening for cervical cancer Pap smear performed with chaperone present. Vulva normal, no rash present. Internal: vagina normal without evidence of injury or bleeding. Cervix appeared normal without erythema, friability, or bleeding. Brush was used to obtain sample for pap cytology. Patient was counseled that spotting was normal after completion of exam. Patient had no further questions or concerns. Interested in STI screening for chlamydia, gonorrhea, and trichomonas.  - Pap cytology ordered with GC/chlamydia, trichomonas, HPV if ASC-US  with reflex to 16/18 genotyping  Hyperlipidemia, unspecified hyperlipidemia type Lipid Panel     Component Value Date/Time   CHOL 165 02/11/2023 1112   TRIG 52 02/11/2023 1112   HDL 52 02/11/2023 1112   CHOLHDL 3.2 02/11/2023 1112   LDLCALC 102 (H) 02/11/2023 1112   LABVLDL 11 02/11/2023 1112  Last lipid panel LDL 102 (2024). Repeat lipid panel ordered. Patient reported that she has been implementing life style changes for her health and has been feeling well. Her goal is to be around 135 pounds and is intermittent fasting (not eating before noon), trying to exercise more, and eating more protein. If lipids remain elevated, given age, counseled patient on need for possible medication management. Patient stated understanding.  - lipid panel ordered - continue lifestyle and diet Irritable bowel syndrome with constipation    Orders Placed This Encounter  Procedures   Tdap vaccine greater than or equal to 7yo IM    Standing Status:   Future    Expected Date:   09/15/2024    Expiration Date:   12/14/2024   Comprehensive metabolic panel with GFR   Lipid Profile  Ambulatory referral to Obstetrics / Gynecology    Referral Priority:   Routine    Referral Type:   Consultation    Referral Reason:   Specialty Services Required    Requested Specialty:   Obstetrics and Gynecology    Number of Visits Requested:   1   Ambulatory referral to Gastroenterology    Referral Priority:   Routine    Referral Type:   Consultation    Referral Reason:   Specialty Services Required    Number of Visits Requested:   1     Terri Cooper, D.O. [facility], PGY-1 Date 03/18/2024 Time 9:51 AM

## 2024-03-18 NOTE — Progress Notes (Signed)
 Internal Medicine Clinic Attending  I was physically present during the key portions of the resident provided service and participated in the medical decision making of patient's management care. I reviewed pertinent patient test results.  The assessment, diagnosis, and plan were formulated together and I agree with the documentation in the resident's note.  Shawn Sick, MD

## 2024-03-18 NOTE — Patient Instructions (Addendum)
 Today we discussed the following medical conditions and plan:   - [facility]: [phone]  - I put in another referral for colonoscopy as well in case they need it - I put in a referral for OBGYN - plug back into the foot doctor for your leg length discrepancy  - we got cholesterol labs and a metabolic panel to check your liver enzymes and electrolytes. I will let you know those results when they come  - keep up the great work!   We look forward to seeing you next time. Please call our clinic at [phone] if you have any questions or concerns. The best time to call is Monday-Friday from 9am-4pm, but there is someone available 24/7. If you need medication refills, please notify your pharmacy one week in advance and they will send us  a request.   Thank you for trusting me with your care. Wishing you the best!   Sallyanne Primas, DO  [facility]

## 2024-03-19 ENCOUNTER — Ambulatory Visit: Payer: Self-pay

## 2024-03-19 LAB — LIPID PANEL
Chol/HDL Ratio: 3.6 ratio (ref 0.0–4.4)
Cholesterol, Total: 193 mg/dL (ref 100–199)
HDL: 53 mg/dL (ref >39)
LDL Chol Calc (NIH): 129 mg/dL — ABNORMAL HIGH (ref 0–99)
Triglycerides: 61 mg/dL (ref 0–149)
VLDL Cholesterol Cal: 11 mg/dL (ref 5–40)

## 2024-03-19 LAB — COMPREHENSIVE METABOLIC PANEL WITH GFR
ALT: 9 IU/L (ref 0–32)
AST: 13 IU/L (ref 0–40)
Albumin: 4.5 g/dL (ref 3.9–4.9)
Alkaline Phosphatase: 50 IU/L (ref 41–116)
BUN/Creatinine Ratio: 16 (ref 9–23)
BUN: 12 mg/dL (ref 6–24)
Bilirubin Total: 0.4 mg/dL (ref 0.0–1.2)
CO2: 23 mmol/L (ref 20–29)
Calcium: 9.4 mg/dL (ref 8.7–10.2)
Chloride: 102 mmol/L (ref 96–106)
Creatinine, Ser: 0.76 mg/dL (ref 0.57–1.00)
Globulin, Total: 3.1 g/dL (ref 1.5–4.5)
Glucose: 85 mg/dL (ref 70–99)
Potassium: 4.2 mmol/L (ref 3.5–5.2)
Sodium: 139 mmol/L (ref 134–144)
Total Protein: 7.6 g/dL (ref 6.0–8.5)
eGFR: 98 mL/min/1.73 (ref >59)

## 2024-03-22 LAB — CYTOLOGY - PAP
Adequacy: ABSENT
Chlamydia: NEGATIVE
Comment: NEGATIVE
Comment: NEGATIVE
Comment: NORMAL
Diagnosis: NEGATIVE
Neisseria Gonorrhea: NEGATIVE
Trichomonas: NEGATIVE

## 2024-03-24 ENCOUNTER — Other Ambulatory Visit: Payer: Self-pay

## 2024-03-24 DIAGNOSIS — Z1231 Encounter for screening mammogram for malignant neoplasm of breast: Secondary | ICD-10-CM

## 2024-04-23 ENCOUNTER — Inpatient Hospital Stay: Admission: RE | Admit: 2024-04-23 | Discharge: 2024-04-23

## 2024-04-23 DIAGNOSIS — Z1231 Encounter for screening mammogram for malignant neoplasm of breast: Secondary | ICD-10-CM

## 2024-05-05 ENCOUNTER — Ambulatory Visit: Payer: Self-pay

## 2024-05-05 NOTE — Telephone Encounter (Signed)
 Called patient regarding negative mammogram. All questions answered. F/u screening recommended in 1 year.

## 2024-06-01 ENCOUNTER — Encounter: Payer: Self-pay | Admitting: Gastroenterology
